# Patient Record
Sex: Male | Born: 1940 | Race: White | Hispanic: No | Marital: Married | Smoking: Former smoker
Health system: Southern US, Community
[De-identification: ages and names within clinical notes are randomized; demographics above are authoritative.]

## PROBLEM LIST (undated history)

## (undated) DIAGNOSIS — G459 Transient cerebral ischemic attack, unspecified: Secondary | ICD-10-CM

## (undated) DIAGNOSIS — E785 Hyperlipidemia, unspecified: Secondary | ICD-10-CM

## (undated) DIAGNOSIS — Z9289 Personal history of other medical treatment: Secondary | ICD-10-CM

## (undated) DIAGNOSIS — I4891 Unspecified atrial fibrillation: Secondary | ICD-10-CM

## (undated) DIAGNOSIS — I671 Cerebral aneurysm, nonruptured: Secondary | ICD-10-CM

## (undated) DIAGNOSIS — I2510 Atherosclerotic heart disease of native coronary artery without angina pectoris: Secondary | ICD-10-CM

## (undated) DIAGNOSIS — I10 Essential (primary) hypertension: Secondary | ICD-10-CM

## (undated) HISTORY — DX: Personal history of other medical treatment: Z92.89

## (undated) HISTORY — DX: Unspecified atrial fibrillation: I48.91

## (undated) HISTORY — DX: Essential (primary) hypertension: I10

## (undated) HISTORY — DX: Atherosclerotic heart disease of native coronary artery without angina pectoris: I25.10

## (undated) HISTORY — DX: Hyperlipidemia, unspecified: E78.5

## (undated) HISTORY — PX: APPENDECTOMY: SHX54

---

## 1998-04-27 ENCOUNTER — Encounter: Payer: Self-pay | Admitting: Neurosurgery

## 1998-04-27 ENCOUNTER — Inpatient Hospital Stay (HOSPITAL_COMMUNITY): Admission: EM | Admit: 1998-04-27 | Discharge: 1998-05-03 | Payer: Self-pay | Admitting: Emergency Medicine

## 1998-04-30 ENCOUNTER — Encounter: Payer: Self-pay | Admitting: Neurosurgery

## 1998-05-12 ENCOUNTER — Ambulatory Visit (HOSPITAL_COMMUNITY): Admission: RE | Admit: 1998-05-12 | Discharge: 1998-05-12 | Payer: Self-pay | Admitting: Neurosurgery

## 1998-05-12 ENCOUNTER — Encounter: Payer: Self-pay | Admitting: Neurosurgery

## 1998-11-27 ENCOUNTER — Encounter: Payer: Self-pay | Admitting: Neurosurgery

## 1998-11-27 ENCOUNTER — Ambulatory Visit (HOSPITAL_COMMUNITY): Admission: RE | Admit: 1998-11-27 | Discharge: 1998-11-27 | Payer: Self-pay | Admitting: Neurosurgery

## 2000-03-13 ENCOUNTER — Emergency Department (HOSPITAL_COMMUNITY): Admission: EM | Admit: 2000-03-13 | Discharge: 2000-03-13 | Payer: Self-pay | Admitting: Emergency Medicine

## 2000-03-13 ENCOUNTER — Encounter: Payer: Self-pay | Admitting: Emergency Medicine

## 2004-09-16 ENCOUNTER — Ambulatory Visit: Payer: Self-pay | Admitting: Cardiology

## 2005-10-13 ENCOUNTER — Ambulatory Visit: Payer: Self-pay | Admitting: Cardiology

## 2006-07-29 ENCOUNTER — Emergency Department (HOSPITAL_COMMUNITY): Admission: EM | Admit: 2006-07-29 | Discharge: 2006-07-29 | Payer: Self-pay | Admitting: Emergency Medicine

## 2006-08-30 ENCOUNTER — Encounter (INDEPENDENT_AMBULATORY_CARE_PROVIDER_SITE_OTHER): Payer: Self-pay | Admitting: *Deleted

## 2006-08-30 ENCOUNTER — Ambulatory Visit: Payer: Self-pay

## 2006-09-26 ENCOUNTER — Encounter: Payer: Self-pay | Admitting: *Deleted

## 2006-10-13 ENCOUNTER — Ambulatory Visit (HOSPITAL_COMMUNITY): Admission: RE | Admit: 2006-10-13 | Discharge: 2006-10-13 | Payer: Self-pay | Admitting: Interventional Radiology

## 2006-10-23 ENCOUNTER — Ambulatory Visit: Payer: Self-pay | Admitting: Cardiology

## 2006-12-18 ENCOUNTER — Ambulatory Visit: Payer: Self-pay | Admitting: Cardiology

## 2007-10-30 ENCOUNTER — Ambulatory Visit: Payer: Self-pay | Admitting: Cardiology

## 2007-10-30 LAB — CONVERTED CEMR LAB
ALT: 19 units/L (ref 0–53)
AST: 24 units/L (ref 0–37)
Bilirubin, Direct: 0.1 mg/dL (ref 0.0–0.3)
CO2: 29 meq/L (ref 19–32)
Chloride: 111 meq/L (ref 96–112)
Sodium: 143 meq/L (ref 135–145)
Total Bilirubin: 0.8 mg/dL (ref 0.3–1.2)
Total CHOL/HDL Ratio: 4
Triglycerides: 144 mg/dL (ref 0–149)

## 2008-09-02 ENCOUNTER — Telehealth: Payer: Self-pay | Admitting: Cardiology

## 2008-10-16 DIAGNOSIS — Z8679 Personal history of other diseases of the circulatory system: Secondary | ICD-10-CM | POA: Insufficient documentation

## 2008-10-16 DIAGNOSIS — E785 Hyperlipidemia, unspecified: Secondary | ICD-10-CM

## 2008-10-16 DIAGNOSIS — I4891 Unspecified atrial fibrillation: Secondary | ICD-10-CM | POA: Insufficient documentation

## 2008-10-20 ENCOUNTER — Ambulatory Visit: Payer: Self-pay | Admitting: Cardiology

## 2008-10-20 DIAGNOSIS — E783 Hyperchylomicronemia: Secondary | ICD-10-CM | POA: Insufficient documentation

## 2008-10-22 ENCOUNTER — Ambulatory Visit: Payer: Self-pay | Admitting: Cardiology

## 2008-10-22 ENCOUNTER — Ambulatory Visit (HOSPITAL_COMMUNITY): Admission: RE | Admit: 2008-10-22 | Discharge: 2008-10-22 | Payer: Self-pay | Admitting: Interventional Radiology

## 2008-11-03 ENCOUNTER — Telehealth: Payer: Self-pay | Admitting: Cardiology

## 2008-11-03 LAB — CONVERTED CEMR LAB
Albumin: 3.9 g/dL (ref 3.5–5.2)
BUN: 19 mg/dL (ref 6–23)
Basophils Relative: 0.1 % (ref 0.0–3.0)
Calcium: 8.9 mg/dL (ref 8.4–10.5)
Cholesterol: 122 mg/dL (ref 0–200)
Eosinophils Absolute: 0.1 10*3/uL (ref 0.0–0.7)
GFR calc non Af Amer: 64 mL/min (ref >60)
Glucose, Bld: 86 mg/dL (ref 70–99)
HDL: 31.8 mg/dL — ABNORMAL LOW (ref >39.00)
Lymphs Abs: 1.6 10*3/uL (ref 0.7–4.0)
MCHC: 34.3 g/dL (ref 30.0–36.0)
MCV: 94 fL (ref 78.0–100.0)
Monocytes Absolute: 0.5 10*3/uL (ref 0.1–1.0)
Neutrophils Relative %: 71.9 % (ref 43.0–77.0)
RBC: 4.78 M/uL (ref 4.22–5.81)
VLDL: 14.6 mg/dL (ref 0.0–40.0)

## 2008-11-27 ENCOUNTER — Telehealth: Payer: Self-pay | Admitting: Cardiology

## 2009-07-08 ENCOUNTER — Telehealth (INDEPENDENT_AMBULATORY_CARE_PROVIDER_SITE_OTHER): Payer: Self-pay | Admitting: *Deleted

## 2009-10-06 ENCOUNTER — Ambulatory Visit: Payer: Self-pay | Admitting: Cardiology

## 2010-04-06 NOTE — Assessment & Plan Note (Signed)
Summary: F1Y/ANAS   Visit Type:  Follow-up Primary Virgina Deakins:  [facility]  CC:  no complaints.  History of Present Illness: Mr Wiswell present evaluation and management of his chronic atrial fibrillation.  History remarkably well. He continues to work on the farm. Orthopedic issues only thing limiting him.  He denies any palpitations, orthopnea, PND, peripheral edema, or chest pain. He's had no symptoms of TIAs or mini strokes.  He remains on Crestor as well as his Aggrenox. He has had a previous stroke.  Current Medications (verified): 1)  Crestor 5 Mg Tabs (Rosuvastatin Calcium) .Marland Kitchen.. 1 Tab At Bedtime 2)  Metoprolol Succinate 25 Mg Xr24h-Tab (Metoprolol Succinate) .Marland Kitchen.. 1 Tab Once Daily 3)  Aggrenox 25-200 Mg Xr12h-Cap (Aspirin-Dipyridamole) .Marland Kitchen.. 1 Tab Two Times A Day 4)  Vitamin C 500 Mg Tabs (Ascorbic Acid) .Marland Kitchen.. 1 Tab Once Daily 5)  Multivitamins   Tabs (Multiple Vitamin) .Marland Kitchen.. 1 Tab Once Daily  Allergies (verified): 1)  ! Zocor 2)  ! Lipitor (Atorvastatin)  Past History:  Past Medical History: Last updated: 10/16/2008 ATRIAL FIBRILLATION, CHRONIC (ICD-427.31) HYPERLIPIDEMIA-MIXED (ICD-272.4) TRANSIENT ISCHEMIC ATTACKS, HX OF (ICD-V12.50)    Past Surgical History: Last updated: 10/16/2008 Appendectomy  Family History: Last updated: 10/16/2008 Mother: arthritis Father: deceased in his 76's..hx of CAD and CHF  Social History: Last updated: 10/16/2008 Full Time Married  Tobacco Use - Former. quit 1999  Risk Factors: Smoking Status: quit (10/16/2008)  Review of Systems       negative history of present illness  Vital Signs:  Patient profile:   70 year old male Height:      70 inches Weight:      183 pounds BMI:     26.35 Pulse rate:   77 / minute BP sitting:   108 / 72  (left arm) Cuff size:   regular  Vitals Entered By: Hardin Negus, RMA (October 06, 2009 3:41 PM)  Physical Exam  General:  Well developed, well nourished, in no acute  distress. Head:  normocephalic and atraumatic Eyes:  PERRLA/EOM intact; conjunctiva and lids normal. Chest Wall:  no deformities or breast masses noted Lungs:  Clear bilaterally to auscultation and percussion. Heart:  PMI nondisplaced, regular rate and rhythm, no gallop Msk:  Back normal, normal gait. Muscle strength and tone normal. Pulses:  pulses normal in all 4 extremities Extremities:  No clubbing or cyanosis. Neurologic:  Alert and oriented x 3. Skin:  Intact without lesions or rashes. Psych:  Normal affect.   EKG  Procedure date:  10/06/2009  Findings:      chronic atrial fibrillation, no change.  Impression & Recommendations:  Problem # 1:  ATRIAL FIBRILLATION, CHRONIC (ICD-427.31) Assessment Unchanged  His updated medication list for this problem includes:    Metoprolol Succinate 25 Mg Xr24h-tab (Metoprolol succinate) .Marland Kitchen... 1 tab once daily    Aggrenox 25-200 Mg Xr12h-cap (Aspirin-dipyridamole) .Marland Kitchen... 1 tab two times a day  Orders: EKG w/ Interpretation (93000)  Problem # 2:  HYPERLIPIDEMIA-MIXED (ICD-272.4)  His updated medication list for this problem includes:    Crestor 5 Mg Tabs (Rosuvastatin calcium) .Marland Kitchen... 1 tab at bedtime  Problem # 3:  TRANSIENT ISCHEMIC ATTACKS, HX OF (ICD-V12.50) Assessment: Improved  Patient Instructions: 1)  Your physician recommends that you schedule a follow-up appointment in: 1 year 2)  Your physician recommends that you continue on your current medications as directed. Please refer to the Current Medication list given to you today.

## 2010-04-06 NOTE — Progress Notes (Signed)
  LOV,12,LAbs faxed over to [facility] [phone] [facility]  Jul 08, 2009 12:38 PM

## 2010-06-12 LAB — CREATININE, SERUM: GFR calc Af Amer: >60 mL/min (ref >60)

## 2010-07-20 NOTE — Assessment & Plan Note (Signed)
[facility]                            CARDIOLOGY OFFICE NOTE   NAME:[name], Ernest                           MRN:          [id number]  DATE:12/18/2006                            DOB:          01/19/1941    [name] returns today for further management of his hyperlipidemia. I  had placed him on Crestor because of a recent cerebrovascular event. 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 He was not on a  statin which Dr. Nash Shearer had advised. He is intolerant of Lipitor and  Pravachol in the past as well as Zetia.   I have placed him on Crestor 5 mg a day. He is having no problems with  the medication. 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 His cholesterol has  dropped from 180 to 134, triglycerides from 83 to 60, HDL has gone up  from 34 to 40. His LDL has dropped from 129 to 82, LFTs were normal.   I have discussed this with he and his wife. His blood pressure is 90/68.  He looks remarkably good.   I have made no changes in his program. I have set him up for followup  with me in September 2009 at which time we will get lipids and LFTs and  clinical followup. He will continue on Toprol, Aggrenox and Crestor.     Thomas C. Daleen Squibb, MD, [facility]  Electronically Signed    TCW/MedQ  DD: 12/18/2006  DT: 12/19/2006  Job #: [id number]   cc:   Estanislado Pandy, MD

## 2010-07-20 NOTE — Consult Note (Signed)
NAME:  Ernest [name], Ernest [name]                  ACCOUNT NO.:  [id number]   MEDICAL RECORD NO.:  [id number]          PATIENT TYPE:  OUT   LOCATION:  XRAY                         FACILITY:  [facility]   PHYSICIAN:  Dr. Corliss Skains          DATE OF BIRTH:  03/26/1940   DATE OF CONSULTATION:  DATE OF DISCHARGE:                                 CONSULTATION   CHIEF COMPLAINT:  Cerebrovascular disease.   HISTORY OF PRESENT ILLNESS:  This is a very pleasant 70 year old male  who was referred to Dr. Corliss Skains through the courtesy of Dr. Nash Shearer  for further evaluation of cerebrovascular disease.  The patient had what  sounds like a TIA in May 2008.  At that time, he had some right-sided  numbness as well as a aphasia that lasted approximately 20 minutes.  EMS  was summoned, and the patient's symptoms resolved after he was given  nasal cannula oxygen.   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  A formal cerebral angiogram has been recommended for further evaluation.  The patient presents today accompanied by his wife to discuss the  angiogram and potential treatment options.  He has had no further TIA  since the event and May.   PAST MEDICAL HISTORY:  1. The patient has a history of hyperlipidemia which is mild.  He has      been intolerant to statins.  2. He has history of chronic atrial fibrillation.  He had been on      Coumadin in the past.  However this was stopped due to frequent      bruising.  3. He also has a history of an intracerebral hemorrhage.  Apparently      he was not on Coumadin at that time.  The etiology was uncertain.      However, the patient made a full recovery.  Apparently he had a      cerebral angiogram performed at that time.  This was about 10 years      ago.  We do not have the results of that study, but  according to      the patient, it failed to show the source of the bleeding.  The      patient also has a history of coronary artery disease.  He had a      heart catheterization in the past that showed nonobstructive      coronary disease.   SURGICAL HISTORY:  Significant for an appendectomy.  The patient reports  no previous problems with anesthesia.   ALLERGIES:  He is intolerant to statins.  There are no known drug  allergies.  He is not allergic to contrast dye or of to latex.   CURRENT MEDICATIONS:  1. Metoprolol.  2. Multivitamin.  3. Vitamin C.  4. Aggrenox.   SOCIAL HISTORY:  The patient is married.  He has two daughters who live  in [place].  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  He quit tobacco in 1999.  He did smoke as well as  chew.   FAMILY HISTORY:  His mother is alive at age 28.  She has arthritis.  His  father died in his 79s.  He had coronary artery disease and congestive  heart failure.   IMPRESSION AND PLAN:  As noted, the patient presents today for further  evaluation of cerebrovascular disease.  He is accompanied by his wife.  Dr. Corliss Skains reviewed the results of the recent MRI/MRA performed August 17, 2006, with the patient and his wife.  He pointed out the absence of  the right vertebral artery.  It could not be ascertained for sure based  on the MRA results, whether this was totally occluded or just severely  stenosed.  A cerebral angiogram has been recommended for further  evaluation.  The patient also may require PTA stenting of the artery, if  there is enough flow to warrant intervention.   The cerebral angiogram as well as a PTA stenting procedures were  described in detail along with the risks and benefits.  The patient and  his wife are anxious to proceed with the angiogram for further  evaluation.  They have a trip planned to [place] at the end of this  month.  We will plan on performing a cerebral  angiogram in early August.  Other recommendations will be based on the angiogram results.   Greater than 40 minutes was spent on this consult.      Delton See, P.A.    ______________________________  Dr. Corliss Skains    DR/MEDQ  D:  09/26/2006  T:  09/26/2006  Job:  [id number]   cc:   Thomas C. Daleen Squibb, MD, [facility]  Estanislado Pandy, MD  Hilda Lias, M.D.

## 2010-07-20 NOTE — Assessment & Plan Note (Signed)
[facility]                            CARDIOLOGY OFFICE NOTE   NAME:Ernest [name], Ernest [name]                           MRN:          [id number]  DATE:10/23/2006                            DOB:          1940/04/08    Ernest [name] comes in today for a followup of his chronic atrial fib.   He had a right sided TIA with right sided numbness and weakness on  Memorial Day weekend.  I referred him to Dr. Nash Shearer of [facility]  Neurology.  He had an extensive evaluation including an arteriogram by  Dr. Corliss Skains.  This showed a totally occluded right vertebral with  distal retrograde reconstitution at the right vertebrobasilar junction.  Medical therapy was recommended including antiplatelets and Statins.  Unfortunately, he has not started a Statin though Dr. Nash Shearer wrote him  for Simvastatin.  He has taken Lipitor and Pravachol in the past as well  as Zetia.  He states Lipitor and Pravachol dropped his blood sugar which  I have argued with him about.  Zetia caused muscle aches.   His LDL runs about 125-130.  He has a low HDL.   His meds are:  1. Vitamin C 500 mg a day.  2. Multivitamin daily.  3. Toprol XL 25 mg a day.  4. Aggrenox 25/200 b.i.d.   PHYSICAL EXAMINATION:  VITAL SIGNS:  His blood pressure is 119/78.  His  pulse 70 and irregular.  His weight is 177.  EKG confirms atrial fib.  HEENT:  Normocephalic atraumatic.  PERRLA.  Extraocular movements  intact.  Sclerae are clear.  Facial symmetry is normal.  NECK:  Supple.  Carotids are full bilaterally without bruit.  There is  no thyromegaly.  Trachea is midline.  LUNGS:  Clear.  HEART:  Reveals a nondisplaced PMI.  A variable rate and rhythm.  ABDOMEN:  Soft.  Good bowel sounds.  No midline bruit.  EXTREMITIES:  Reveal no cyanosis, clubbing, or edema.  Pulses are brisk.  NEUROLOGIC:  Grossly intact.   He also had a transthoracic echo in our office on August 30, 2006.  This  showed normal left ventricular chamber  size, EF 50-55%, mild mitral  valve regurgitation, moderately dilated left atrium, redundancy of the  interatrial septum, moderate dilatation of the right atrium.  There was  no obvious clot.   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  I am  surprised he did not have more brain stem symptoms, however.  Fortunately, it did resolve.  I do not think that he had a cardiogenic  source of embolus.  This most likely was plaque and intrinsic clot that  occluded his vessel. I feel strongly that he does need to remain on  antiplatelet therapy and a Statin.  I have had a good  discussion with he and his wife.  They are willing to try Crestor at 5  mg a day.  I will schedule blood work in about 6 weeks and followup with  me in about 8-10  weeks.     Ernest Fus C. Daleen Squibb, MD, [facility]  Electronically Signed    TCW/MedQ  DD: 10/23/2006  DT: 10/23/2006  Job #: [id number]   cc:   Ernest Pandy, MD

## 2010-07-20 NOTE — Assessment & Plan Note (Signed)
[facility]                            CARDIOLOGY OFFICE NOTE   NAME:Riecke, Jermarcus                           MRN:          [id number]  DATE:10/30/2007                            DOB:          16-Oct-1940    Levi comes in today for further management of his chronic atrial fib,  hyperlipidemia, history of right-sided TIA with total resolution on last  Memorial Day 2008.   At that time, he had a totally occluded right vertebral with distal  retrograde reconstitution at his right vertebrobasilar junction.  Medical therapy was recommended.   Fortunately, he has been able to tolerate the Crestor with a significant  improvement in his lipids.  His last LDL was 82.  His HDL was 40.  His  total cholesterol 134.   He continues on,  1. Crestor 5 mg a day.  2. Aggrenox 25/200 b.i.d.  3. Toprol-XL 25 mg a day.  4. Multivitamin daily.  5. Vitamin C 500 mg a day.   He looks remarkably good today.  He denies any orthopnea or PND,  tachypalpitations, presyncope, or syncope.  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   PHYSICAL EXAMINATION:  VITAL SIGNS:  His blood pressure today is 110/72,  his pulse is 68 and regular.  HEENT:  Normocephalic and atraumatic.  PERRLA and extraocular movements  are intact.  Sclerae are clear.  Facial symmetry is normal.  NECK:  Carotid upstrokes are equal bilaterally without bruits, no JVD.  Thyroid is not enlarged.  Trachea is midline.  LUNGS:  Clear.  HEART:  Irregular rate and rhythm, well-controlled rate.  ABDOMEN:  Soft, good bowel sounds.  No midline bruits.  EXTREMITY:  No cyanosis, clubbing, or edema.  Pulses are intact.  NEURO:  Intact.   EKG shows atrial fib with a well-controlled ventricular rate.   ASSESSMENT AND PLAN:  Remer is doing very well.  I have made no changes  to his medical program.  We checked lipids today as well as a  comprehensive metabolic panel.   Assuming these are stable, we will see  him back again in a year.   Please note that he has had an intracranial bleed on aspirin in the  past.     Maisie Fus C. Daleen Squibb, MD, [facility]  Electronically Signed    TCW/MedQ  DD: 10/30/2007  DT: 10/31/2007  Job #: [id number]

## 2010-07-23 NOTE — Assessment & Plan Note (Signed)
[facility]                              CARDIOLOGY OFFICE NOTE   NAME:Bowermaster, Danton                           MRN:          [id number]  DATE:10/13/2005                            DOB:          Sep 01, 1940    HISTORY:  1. Mr. Trull returns today for further management of his chronic atrial      fib.  He is totally asymptomatic.  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      He is on aspirin 325 a day because of a previous intracranial bleed.      This was actually on aspirin but he has been cleared by neurosurgery.  2. Hyperlipidemia.  He has been intolerant of statins and Zetia.  He      watches his diet very closely.  3. Nonobstructive coronary disease.   He offers no complaints today.   EXAMINATION:  VITAL SIGNS:  Blood pressure 116/90, pulse 66 and irregular.  His EKG shows atrial fib with no changes.  His weight is 177.  NECK:  Carotid upstrokes are equal bilaterally without bruits.  No JVD.  Thyroid is not enlarged.  Trachea is midline.  LUNGS:  Clear.  HEART:  Irregular rate and rhythm.  No murmurs.  ABDOMEN:  Soft with good bowel sounds.  There is no midline bruit.  There is  no hepatomegaly.  EXTREMITIES:  No clubbing, cyanosis or edema.  Pulses are intact.   ASSESSMENT/PLAN:  Kyrese is doing well.  I have renewed his metoprolol CR or  XL 25 mg a day.  He will continue with aspirin 325 a day.  I will see him  back in a year.   He has no one drawing blood work for him, i.e. no primary care physician.  He is having some fatigue.   I have written to get a CBC, Comprehensive Metabolic Panel, TSH, and a PSA.                               Thomas C. Daleen Squibb, MD, [facility]    TCW/MedQ  DD:  10/13/2005  DT:  10/13/2005  Job #:  [id number]

## 2010-10-20 ENCOUNTER — Encounter: Payer: Self-pay | Admitting: Cardiology

## 2010-10-20 ENCOUNTER — Ambulatory Visit (INDEPENDENT_AMBULATORY_CARE_PROVIDER_SITE_OTHER): Payer: Medicare Other | Admitting: Cardiology

## 2010-10-20 VITALS — BP 110/74 | HR 88 | Resp 12 | Ht 70.0 in | Wt 187.0 lb

## 2010-10-20 DIAGNOSIS — Z8679 Personal history of other diseases of the circulatory system: Secondary | ICD-10-CM

## 2010-10-20 DIAGNOSIS — I4891 Unspecified atrial fibrillation: Secondary | ICD-10-CM

## 2010-10-20 DIAGNOSIS — E785 Hyperlipidemia, unspecified: Secondary | ICD-10-CM

## 2010-10-20 NOTE — Patient Instructions (Signed)
Your physician recommends that you follow up in: 12 Months. You will receive a reminder letter in the mail two months in advance. If you don't receive a letter, please call our office to schedule the follow up appointment. Your physician recommends that you continue on your current medications as directed. Please refer to the Current Medication list given to you today.

## 2010-10-20 NOTE — Assessment & Plan Note (Signed)
Stable. No change in treatment. Followup in a year 

## 2010-10-20 NOTE — Progress Notes (Signed)
HPI Ernest [name] comes in for evaluation and management of his chronic A. Fib. Other than some mild dyspnea on exertion, he has no new complaints. He denies any chest pain or angina. His labs and other problems are followed by his primary care in [place].  He is very compliant. Meds reviewed.  EKG shows chronic A. Fib with controlled ventricular rate. Past Medical History  Diagnosis Date  . Clotting disorder   . Hyperlipidemia   . Atrial fibrillation   . Hypertension     History reviewed. No pertinent past surgical history.  Family History  Problem Relation Age of Onset  . Heart disease Father   . Hypertension Father     History   Social History  . Marital Status: Married    Spouse Name: N/A    Number of Children: N/A  . Years of Education: N/A   Occupational History  . Not on file.   Social History Main Topics  . Smoking status: Former Games developer  . Smokeless tobacco: Not on file  . Alcohol Use: Not on file  . Drug Use: Not on file  . Sexually Active: Not on file   Other Topics Concern  . Not on file   Social History Narrative  . No narrative on file    Allergies  Allergen Reactions  . Atorvastatin   . Simvastatin     Current Outpatient Prescriptions  Medication Sig Dispense Refill  . AGGRENOX 25-200 MG per 12 hr capsule Take 1 tablet by mouth Daily.      . CRESTOR 5 MG tablet Take 1 tablet by mouth Daily.      . Multiple Vitamin (MULTIVITAMIN) capsule Take 1 capsule by mouth daily.        . TOPROL XL 25 MG 24 hr tablet Take 1 tablet by mouth Daily.      . vitamin C (ASCORBIC ACID) 500 MG tablet Take 500 mg by mouth daily.          ROS Negative other than HPI.   PE General Appearance: well developed, well nourished in no acute distress HEENT: symmetrical face, PERRLA, good dentition  Neck: no JVD, thyromegaly, or adenopathy, trachea midline Chest: symmetric without deformity Cardiac: PMI non-displaced, RRR, normal S1, S2, no gallop or murmur Lung:  clear to ausculation and percussion Vascular: all pulses full without bruits  Abdominal: nondistended, nontender, good bowel sounds, no HSM, no bruits Extremities: no cyanosis, clubbing or edema, no sign of DVT, no varicosities  Skin: normal color, no rashes Neuro: alert and oriented x 3, non-focal Pysch: normal affect Filed Vitals:   10/20/10 0918  BP: 110/74  Pulse: 88  Resp: 12  Height: 5\' 10"  (1.778 m)  Weight: 187 lb (84.823 kg)    EKG  Labs and Studies Reviewed.   Lab Results  Component Value Date   WBC 7.9 10/22/2008   HGB 15.4 10/22/2008   HCT 44.9 10/22/2008   MCV 94.0 10/22/2008   PLT 210.0 10/22/2008      Chemistry      Component Value Date/Time   NA 143 10/22/2008 0956   K 4.8 10/22/2008 0956   CL 109 10/22/2008 0956   CO2 33* 10/22/2008 0956   BUN 18 10/22/2008 1048   CREATININE 1.17 10/22/2008 1048      Component Value Date/Time   CALCIUM 8.9 10/22/2008 0956   ALKPHOS 55 10/22/2008 0956   AST 19 10/22/2008 0956   ALT 18 10/22/2008 0956   BILITOT 1.0 10/22/2008 0956  Lab Results  Component Value Date   CHOL 122 10/22/2008   CHOL 147 10/30/2007   Lab Results  Component Value Date   HDL 31.80* 10/22/2008   HDL 36.9* 10/30/2007   Lab Results  Component Value Date   LDLCALC 76 10/22/2008   LDLCALC 81 10/30/2007   Lab Results  Component Value Date   TRIG 73.0 10/22/2008   TRIG 144 10/30/2007   Lab Results  Component Value Date   CHOLHDL 4 10/22/2008   CHOLHDL 4.0 CALC 10/30/2007   No results found for this basename: HGBA1C   Lab Results  Component Value Date   ALT 18 10/22/2008   AST 19 10/22/2008   ALKPHOS 55 10/22/2008   BILITOT 1.0 10/22/2008   No results found for this basename: TSH   and

## 2010-11-10 ENCOUNTER — Telehealth: Payer: Self-pay | Admitting: Cardiology

## 2010-11-10 NOTE — Telephone Encounter (Signed)
Patient had given the wrong information on the aggrenox.  Please advise on the new direction / dosage

## 2010-11-10 NOTE — Telephone Encounter (Signed)
LMTCB Debbie Dyllin Gulley RN  

## 2010-11-10 NOTE — Telephone Encounter (Signed)
Wife calls today to confirm pt is taking aggrenox q12 hours as directed I will update medication list. Mylo Red RN

## 2010-12-20 LAB — BASIC METABOLIC PANEL
CO2: 29
Calcium: 9
Chloride: 103
Glucose, Bld: 88
Potassium: 4
Sodium: 137

## 2010-12-20 LAB — CBC
HCT: 41.5
Hemoglobin: 14.4
MCHC: 34.8
MCV: 91.4
RDW: 13.3

## 2011-02-09 ENCOUNTER — Other Ambulatory Visit: Payer: Self-pay | Admitting: Cardiology

## 2011-02-11 ENCOUNTER — Other Ambulatory Visit: Payer: Self-pay | Admitting: Cardiology

## 2011-02-11 NOTE — Telephone Encounter (Signed)
rx request... Not sure if they meant 30 or literally 3 with 2 refills. No history of exactly how it was ordered. Maybe you will have better luck finding it.

## 2011-03-10 ENCOUNTER — Emergency Department (HOSPITAL_COMMUNITY)
Admission: EM | Admit: 2011-03-10 | Discharge: 2011-03-10 | Disposition: A | Discharge status: Home / Self Care | Payer: Medicare Other | Attending: Emergency Medicine | Admitting: Emergency Medicine

## 2011-03-10 ENCOUNTER — Encounter (HOSPITAL_COMMUNITY): Payer: Self-pay

## 2011-03-10 ENCOUNTER — Emergency Department (HOSPITAL_COMMUNITY): Payer: Medicare Other

## 2011-03-10 ENCOUNTER — Other Ambulatory Visit: Payer: Self-pay

## 2011-03-10 DIAGNOSIS — J3489 Other specified disorders of nose and nasal sinuses: Secondary | ICD-10-CM | POA: Insufficient documentation

## 2011-03-10 DIAGNOSIS — R5381 Other malaise: Secondary | ICD-10-CM | POA: Insufficient documentation

## 2011-03-10 DIAGNOSIS — R5383 Other fatigue: Secondary | ICD-10-CM | POA: Insufficient documentation

## 2011-03-10 DIAGNOSIS — I10 Essential (primary) hypertension: Secondary | ICD-10-CM | POA: Insufficient documentation

## 2011-03-10 DIAGNOSIS — R05 Cough: Secondary | ICD-10-CM | POA: Insufficient documentation

## 2011-03-10 DIAGNOSIS — IMO0001 Reserved for inherently not codable concepts without codable children: Secondary | ICD-10-CM | POA: Insufficient documentation

## 2011-03-10 DIAGNOSIS — E785 Hyperlipidemia, unspecified: Secondary | ICD-10-CM | POA: Insufficient documentation

## 2011-03-10 DIAGNOSIS — R42 Dizziness and giddiness: Secondary | ICD-10-CM | POA: Insufficient documentation

## 2011-03-10 DIAGNOSIS — I959 Hypotension, unspecified: Secondary | ICD-10-CM | POA: Insufficient documentation

## 2011-03-10 DIAGNOSIS — R509 Fever, unspecified: Secondary | ICD-10-CM | POA: Insufficient documentation

## 2011-03-10 DIAGNOSIS — R0602 Shortness of breath: Secondary | ICD-10-CM | POA: Insufficient documentation

## 2011-03-10 DIAGNOSIS — E860 Dehydration: Secondary | ICD-10-CM | POA: Insufficient documentation

## 2011-03-10 DIAGNOSIS — R059 Cough, unspecified: Secondary | ICD-10-CM | POA: Insufficient documentation

## 2011-03-10 LAB — DIFFERENTIAL
Eosinophils Absolute: 0.1 10*3/uL (ref 0.0–0.7)
Eosinophils Relative: 1 % (ref 0–5)
Lymphocytes Relative: 9 % — ABNORMAL LOW (ref 12–46)
Lymphs Abs: 1.5 10*3/uL (ref 0.7–4.0)
Monocytes Relative: 7 % (ref 3–12)

## 2011-03-10 LAB — CULTURE, BLOOD (ROUTINE X 2)
Culture  Setup Time: 201301040240
Culture  Setup Time: 201301040240
Culture: NO GROWTH

## 2011-03-10 LAB — CBC
Hemoglobin: 16.2 g/dL (ref 13.0–17.0)
MCH: 32.3 pg (ref 26.0–34.0)
MCV: 92.2 fL (ref 78.0–100.0)
RBC: 5.02 MIL/uL (ref 4.22–5.81)

## 2011-03-10 LAB — CARDIAC PANEL(CRET KIN+CKTOT+MB+TROPI)
CK, MB: 1.9 ng/mL (ref 0.3–4.0)
Relative Index: INVALID (ref 0.0–2.5)
Total CK: 46 U/L (ref 7–232)
Troponin I: <0.3 ng/mL (ref <0.30)

## 2011-03-10 LAB — COMPREHENSIVE METABOLIC PANEL
Alkaline Phosphatase: 67 U/L (ref 39–117)
BUN: 21 mg/dL (ref 6–23)
CO2: 25 mEq/L (ref 19–32)
Calcium: 9.5 mg/dL (ref 8.4–10.5)
GFR calc Af Amer: 70 mL/min — ABNORMAL LOW (ref >90)
GFR calc non Af Amer: 60 mL/min — ABNORMAL LOW (ref >90)
Glucose, Bld: 96 mg/dL (ref 70–99)
Potassium: 4.2 mEq/L (ref 3.5–5.1)
Total Protein: 8.2 g/dL (ref 6.0–8.3)

## 2011-03-10 LAB — URINALYSIS, ROUTINE W REFLEX MICROSCOPIC
Ketones, ur: 15 mg/dL — AB
Leukocytes, UA: NEGATIVE
Nitrite: NEGATIVE
Urobilinogen, UA: 0.2 mg/dL (ref 0.0–1.0)
pH: 5.5 (ref 5.0–8.0)

## 2011-03-10 LAB — LACTIC ACID, PLASMA: Lactic Acid, Venous: 1 mmol/L (ref 0.5–2.2)

## 2011-03-10 MED ORDER — SODIUM CHLORIDE 0.9 % IV BOLUS (SEPSIS): 1000.0000 mL | Freq: Once | INTRAVENOUS | Status: DC

## 2011-03-10 MED ORDER — SODIUM CHLORIDE 0.9 % IV BOLUS (SEPSIS)
1000.0000 mL | Freq: Once | INTRAVENOUS | Status: AC
  Administered 2011-03-10: 1000 mL via INTRAVENOUS

## 2011-03-10 NOTE — ED Provider Notes (Signed)
Patient presenting with orthostatic hypotension from his physician's office. He states that over the last one to 2 weeks he has felt generally weak as well as mildly short of breath with exertion. He proximally 2 weeks ago had muscle aches but no vomiting or diarrhea. He has had a poor appetite. He was noted to have a systolic blood pressure 79 in his primary doctor's office. Therefore was sent to the ED for further evaluation. On exam he is awake and alert. He appears nontoxic and well-hydrated with clear lungs benign abdomen. Initial labs and IV hydration as well as EKG was ordered in triage. Patient is moved to a room in the ED for further evaluation and management.  Ethelda Chick, MD 03/10/11 [phone]

## 2011-03-10 NOTE — ED Notes (Signed)
Patient is resting comfortably. 

## 2011-03-10 NOTE — ED Notes (Signed)
Pt. Was sent to [place] from [facility].  Due to hypotension.  Pt. Has has a  Intermittent fever, sob on exertion,  And weak for the last 2 weeks

## 2011-03-10 NOTE — ED Provider Notes (Signed)
History     CSN: [id number]  Arrival date & time 03/10/11  1208   First MD Initiated Contact with Patient 03/10/11 1646      Chief Complaint  Patient presents with  . Hypotension    (Consider location/radiation/quality/duration/timing/severity/associated sxs/prior treatment) HPI Comments: Patient is a 71 year old male with a one and a half week history of cough. Initially when symptoms started he felt like he had the flu with generalized body aches, fever, productive cough, congestion. He started taking Mucinex and Alka-Seltzer and he states the congestion and cough improved. However the last few days he has had worsening shortness of breath and weakness with only minimal exertion. Also when he stands for any length of time he feels very dizzy and lightheaded. He denies any chest pain and states his symptoms are improved if he sits down and rests. He saw his PCP today and had hypotension with a blood pressure of 79/50 taken twice. He states he did take his metoprolol he were going to see his PCP.  The history is provided by the patient.    Past Medical History  Diagnosis Date  . Clotting disorder   . Hyperlipidemia   . Campath-induced atrial fibrillation   . Hypertension     Past Surgical History  Procedure Date  . Appendectomy     Family History  Problem Relation Age of Onset  . Heart disease Father   . Hypertension Father   . Heart failure Father   . Arthritis Mother     History  Substance Use Topics  . Smoking status: Former Games developer  . Smokeless tobacco: Never Used  . Alcohol Use: No      Review of Systems  Constitutional: Positive for fever.  Respiratory: Positive for cough and shortness of breath.   Cardiovascular: Negative for chest pain and leg swelling.  Gastrointestinal: Negative for nausea, vomiting, abdominal pain, diarrhea and blood in stool.  Neurological: Positive for weakness and light-headedness.  Psychiatric/Behavioral: Negative for confusion.    All other systems reviewed and are negative.    Allergies  Atorvastatin and Simvastatin  Home Medications   Current Outpatient Rx  Name Route Sig Dispense Refill  . AGGRENOX 25-200 MG PO CP12  TAKE 1 CAPSULE TWICE A DAY 3 capsule 2  . CRESTOR 5 MG PO TABS  TAKE 1 TABLET AT BEDTIME 90 tablet 2  . DM-GUAIFENESIN ER 30-600 MG PO TB12 Oral Take 1 tablet by mouth every 12 (twelve) hours.      Marland Kitchen METOPROLOL SUCCINATE ER 25 MG PO TB24  TAKE 1 TABLET ONCE DAILY 90 tablet 2  . MULTIVITAMINS PO CAPS Oral Take 1 capsule by mouth daily.      Marland Kitchen ALKA-SELTZER PLUS COLD & COUGH PO Oral Take 1 tablet by mouth 2 (two) times daily.      Marland Kitchen VITAMIN C 500 MG PO TABS Oral Take 500 mg by mouth daily.        BP 102/74  Pulse 84  Temp(Src) 98.2 F (36.8 C) (Oral)  Resp 18  Ht 6' (1.829 m)  Wt 180 lb (81.647 kg)  BMI 24.41 kg/m2  SpO2 97%  Physical Exam  Nursing note and vitals reviewed. Constitutional: He is oriented to person, place, and time. He appears well-developed and well-nourished. No distress.  HENT:  Head: Normocephalic and atraumatic.  Mouth/Throat: Oropharynx is clear and moist.  Eyes: Conjunctivae and EOM are normal. Pupils are equal, round, and reactive to light.  Neck: Normal range of motion. Neck supple.  Cardiovascular: Normal rate, regular rhythm and intact distal pulses.   No murmur heard. Pulmonary/Chest: Effort normal and breath sounds normal. No respiratory distress. He has no wheezes. He has no rales.  Abdominal: Soft. He exhibits no distension. There is no tenderness. There is no rebound and no guarding.  Musculoskeletal: Normal range of motion. He exhibits no edema and no tenderness.  Neurological: He is alert and oriented to person, place, and time.  Skin: Skin is warm and dry. No rash noted. No erythema.  Psychiatric: He has a normal mood and affect. His behavior is normal.    ED Course  Procedures (including critical care time)  Labs Reviewed  CBC - Abnormal;  Notable for the following:    WBC 16.8 (*)    All other components within normal limits  DIFFERENTIAL - Abnormal; Notable for the following:    Neutrophils Relative 83 (*)    Neutro Abs 13.9 (*)    Lymphocytes Relative 9 (*)    Monocytes Absolute 1.3 (*)    All other components within normal limits  COMPREHENSIVE METABOLIC PANEL - Abnormal; Notable for the following:    GFR calc non Af Amer 60 (*)    GFR calc Af Amer 70 (*)    All other components within normal limits  URINALYSIS, ROUTINE W REFLEX MICROSCOPIC - Abnormal; Notable for the following:    Ketones, ur 15 (*)    All other components within normal limits  LACTIC ACID, PLASMA  CARDIAC PANEL(CRET KIN+CKTOT+MB+TROPI)  URINE CULTURE  CULTURE, BLOOD (ROUTINE X 2)  CULTURE, BLOOD (ROUTINE X 2)   No results found.   Date: 03/10/2011  Rate: 89  Rhythm: atrial fibrillation  QRS Axis: normal  Intervals: normal  ST/T Wave abnormalities: normal  Conduction Disutrbances:none  Narrative Interpretation:   Old EKG Reviewed: unchanged    No diagnosis found.    MDM   Patient has had illness for almost 2 weeks initially starting with cough, fever, congestion and now those symptoms have resolved and he is short of breath and weak worse with exertion and if he stands too long he feels dizzy. He was seen his PCP office today and had a blood pressure of 79/50. Since he has been here his blood pressure does not drop below 100/70 however he states he just feels weak. He denies any chest pain, swelling, abdominal pain, vomiting, diarrhea. He is well-appearing on exam. CBC with leukocytosis of 16,000 and chest x-ray within normal limits. EKG shows persistent atrial fibrillation without change. BMP, UA, lactate all pending. Will give patient a bolus of IV fluid as there is no sign of fluid overload.   8:57 PM Other than an elevated leukocytosis all labs and films are within normal limits. After 1 L of fluid evaluated patient and he feels  much better. Although his past medical history his has a clotting disorder he states he's never had a blood clot in his lungs or his leg he's had an aneurysm in his brain but no other abnormalities. Feel this is a low possibility for PE and most likely he had the flu earlier last week which caused him to be severely dehydrated which caused the weakness. After the 1 L of fluid patient was able to get up and walk around the ER without any weakness or shortness of breath which he states prior he could barely walk down the hall without feeling weak and short of breath.     Gwyneth Sprout, MD 03/10/11 2141

## 2011-03-10 NOTE — ED Notes (Signed)
PT ambulated with a steady gait; VSS; no signs of distress; respirations even and unlabored; no questions at this time.

## 2011-03-10 NOTE — ED Notes (Signed)
PT ambulated around ED 3 times with no signs of lightheadedness or dizziness. MD made aware.

## 2011-03-10 NOTE — ED Notes (Signed)
Assisted [place], [place] with placing pt on monitor, continuous pulse oximetry and blood pressure cuff; family at bedside

## 2011-03-12 LAB — URINE CULTURE: Culture  Setup Time: 201301032119

## 2011-03-22 ENCOUNTER — Encounter: Payer: Self-pay | Admitting: Cardiology

## 2011-03-22 ENCOUNTER — Ambulatory Visit (INDEPENDENT_AMBULATORY_CARE_PROVIDER_SITE_OTHER): Payer: Medicare Other | Admitting: Cardiology

## 2011-03-22 ENCOUNTER — Telehealth: Payer: Self-pay | Admitting: Cardiology

## 2011-03-22 VITALS — BP 105/80 | HR 82 | Ht 70.0 in | Wt 188.0 lb

## 2011-03-22 DIAGNOSIS — I4891 Unspecified atrial fibrillation: Secondary | ICD-10-CM

## 2011-03-22 DIAGNOSIS — E785 Hyperlipidemia, unspecified: Secondary | ICD-10-CM

## 2011-03-22 DIAGNOSIS — Z8679 Personal history of other diseases of the circulatory system: Secondary | ICD-10-CM

## 2011-03-22 NOTE — Assessment & Plan Note (Signed)
No recurrence. Continue Aggrenox, not Coumadin candidate history of intracranial bleed. Nothing on exam indicates obstructive disease.

## 2011-03-22 NOTE — Patient Instructions (Signed)
Your physician wants you to follow-up in:  12 months.  You will receive a reminder letter in the mail two months in advance. If you don't receive a letter, please call our office to schedule the follow-up appointment.   

## 2011-03-22 NOTE — Assessment & Plan Note (Signed)
Stable. No change in medication. Followup in one year.

## 2011-03-22 NOTE — Assessment & Plan Note (Signed)
Continue statin. All blood work with primary care.

## 2011-03-22 NOTE — Progress Notes (Signed)
HPI Ernest [name] comes in today for that which the management of his chronic A. Fib. He also has a history of hypertension and hyperlipidemia.  He developed flulike symptoms right around Christmas with fever and muscle aches. He began to have symptoms of orthostasis and was evaluated by his primary care who found his blood pressure to be low at 79/50. He was sent to emergency room at [facility] where he was thoroughly evaluated. The only abnormality was an elevated white count without shift. Chest x-ray was clear. He was given 2 L of fluid and improved.  He still weak and gets short of breath when he pushes himself. He has had no chest pain. He denies any palpitations.  Is back to his family physician and his white blood cell count has come down. He was started on medication which sounds like an antibiotic he does not have today.  He's had no symptoms of TIAs or mini strokes. He remains on Aggrenox. He is not a candidate for Coumadin because of a history of intracranial bleed. 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  Past Medical History  Diagnosis Date  . Clotting disorder   . Hyperlipidemia   . Atrial fibrillation   . Hypertension     Current Outpatient Prescriptions  Medication Sig Dispense Refill  . AGGRENOX 25-200 MG per 12 hr capsule TAKE 1 CAPSULE TWICE A DAY  3 capsule  2  . CRESTOR 5 MG tablet TAKE 1 TABLET AT BEDTIME  90 tablet  2  . metoprolol succinate (TOPROL-XL) 25 MG 24 hr tablet TAKE 1 TABLET ONCE DAILY  90 tablet  2  . Multiple Vitamin (MULTIVITAMIN) capsule Take 1 capsule by mouth daily.        . vitamin C (ASCORBIC ACID) 500 MG tablet Take 500 mg by mouth daily.        Marland Kitchen dextromethorphan-guaiFENesin (MUCINEX DM) 30-600 MG per 12 hr tablet Take 1 tablet by mouth every 12 (twelve) hours.        . Phenyleph-CPM-DM-Aspirin (ALKA-SELTZER PLUS COLD & COUGH PO) Take 1 tablet by mouth 2 (two) times daily.          Allergies  Allergen Reactions  .  Atorvastatin   . Simvastatin     Family History  Problem Relation Age of Onset  . Heart disease Father   . Hypertension Father   . Heart failure Father   . Arthritis Mother     History   Social History  . Marital Status: Married    Spouse Name: N/A    Number of Children: N/A  . Years of Education: N/A   Occupational History  . Not on file.   Social History Main Topics  . Smoking status: Former Games developer  . Smokeless tobacco: Never Used  . Alcohol Use: No  . Drug Use: No  . Sexually Active: Not on file   Other Topics Concern  . Not on file   Social History Narrative  . No narrative on file    ROS ALL NEGATIVE EXCEPT THOSE NOTED IN HPI  PE  General Appearance: well developed, well nourished in no acute distress HEENT: symmetrical face, PERRLA, good dentition  Neck: no JVD, thyromegaly, or adenopathy, trachea midline Chest: symmetric without deformity Cardiac: PMI non-displaced, regular rate and rhythm normal S1, S2, no gallop or murmur Lung: clear to ausculation and percussion Vascular: all pulses full without bruits , no carotid bruits Abdominal: nondistended, nontender, good bowel sounds, no HSM, no bruits  Extremities: no cyanosis, clubbing or edema, no sign of DVT, no varicosities  Skin: normal color, no rashes Neuro: alert and oriented x 3, non-focal Pysch: normal affect  EKG  BMET    Component Value Date/Time   NA 139 03/10/2011 1758   K 4.2 03/10/2011 1758   CL 101 03/10/2011 1758   CO2 25 03/10/2011 1758   GLUCOSE 96 03/10/2011 1758   BUN 21 03/10/2011 1758   CREATININE 1.19 03/10/2011 1758   CALCIUM 9.5 03/10/2011 1758   GFRNONAA 60* 03/10/2011 1758   GFRAA 70* 03/10/2011 1758    Lipid Panel     Component Value Date/Time   CHOL 122 10/22/2008 0956   TRIG 73.0 10/22/2008 0956   HDL 31.80* 10/22/2008 0956   CHOLHDL 4 10/22/2008 0956   VLDL 14.6 10/22/2008 0956   LDLCALC 76 10/22/2008 0956    CBC    Component Value Date/Time   WBC 16.8* 03/10/2011 1758   RBC  5.02 03/10/2011 1758   HGB 16.2 03/10/2011 1758   HCT 46.3 03/10/2011 1758   PLT 386 03/10/2011 1758   MCV 92.2 03/10/2011 1758   MCH 32.3 03/10/2011 1758   MCHC 35.0 03/10/2011 1758   RDW 11.9 03/10/2011 1758   LYMPHSABS 1.5 03/10/2011 1758   MONOABS 1.3* 03/10/2011 1758   EOSABS 0.1 03/10/2011 1758   BASOSABS 0.0 03/10/2011 1758

## 2011-03-22 NOTE — Telephone Encounter (Signed)
Walk in pt form " Pt Dropped off Prescription Renewal Form from [employer]" sent to  [facility]  03/22/11/KM

## 2011-09-22 ENCOUNTER — Emergency Department (HOSPITAL_COMMUNITY)
Admission: EM | Admit: 2011-09-22 | Discharge: 2011-09-22 | Disposition: A | Discharge status: Home / Self Care | Payer: Medicare Other | Attending: Emergency Medicine | Admitting: Emergency Medicine

## 2011-09-22 ENCOUNTER — Emergency Department (HOSPITAL_COMMUNITY): Payer: Medicare Other

## 2011-09-22 ENCOUNTER — Encounter (HOSPITAL_COMMUNITY): Payer: Self-pay | Admitting: *Deleted

## 2011-09-22 ENCOUNTER — Other Ambulatory Visit: Payer: Self-pay

## 2011-09-22 DIAGNOSIS — R5383 Other fatigue: Secondary | ICD-10-CM | POA: Insufficient documentation

## 2011-09-22 DIAGNOSIS — R5381 Other malaise: Secondary | ICD-10-CM | POA: Insufficient documentation

## 2011-09-22 DIAGNOSIS — I4891 Unspecified atrial fibrillation: Secondary | ICD-10-CM | POA: Insufficient documentation

## 2011-09-22 DIAGNOSIS — Z79899 Other long term (current) drug therapy: Secondary | ICD-10-CM | POA: Insufficient documentation

## 2011-09-22 DIAGNOSIS — R42 Dizziness and giddiness: Secondary | ICD-10-CM | POA: Insufficient documentation

## 2011-09-22 DIAGNOSIS — Z8673 Personal history of transient ischemic attack (TIA), and cerebral infarction without residual deficits: Secondary | ICD-10-CM | POA: Insufficient documentation

## 2011-09-22 DIAGNOSIS — E785 Hyperlipidemia, unspecified: Secondary | ICD-10-CM | POA: Insufficient documentation

## 2011-09-22 DIAGNOSIS — R531 Weakness: Secondary | ICD-10-CM

## 2011-09-22 DIAGNOSIS — Z9089 Acquired absence of other organs: Secondary | ICD-10-CM | POA: Insufficient documentation

## 2011-09-22 HISTORY — DX: Cerebral aneurysm, nonruptured: I67.1

## 2011-09-22 HISTORY — DX: Transient cerebral ischemic attack, unspecified: G45.9

## 2011-09-22 LAB — CBC WITH DIFFERENTIAL/PLATELET
Basophils Absolute: 0 10*3/uL (ref 0.0–0.1)
Basophils Relative: 0 % (ref 0–1)
Lymphocytes Relative: 21 % (ref 12–46)
MCHC: 33.5 g/dL (ref 30.0–36.0)
Monocytes Absolute: 0.5 10*3/uL (ref 0.1–1.0)
Neutro Abs: 2.7 10*3/uL (ref 1.7–7.7)
Platelets: 198 10*3/uL (ref 150–400)
RDW: 12.3 % (ref 11.5–15.5)
WBC: 4 10*3/uL (ref 4.0–10.5)

## 2011-09-22 LAB — URINALYSIS, ROUTINE W REFLEX MICROSCOPIC
Glucose, UA: NEGATIVE mg/dL
Ketones, ur: NEGATIVE mg/dL
Leukocytes, UA: NEGATIVE
Nitrite: NEGATIVE
Protein, ur: NEGATIVE mg/dL
pH: 5.5 (ref 5.0–8.0)

## 2011-09-22 LAB — BASIC METABOLIC PANEL
CO2: 27 mEq/L (ref 19–32)
Calcium: 9 mg/dL (ref 8.4–10.5)
Chloride: 103 mEq/L (ref 96–112)
Creatinine, Ser: 1.22 mg/dL (ref 0.50–1.35)
GFR calc Af Amer: 68 mL/min — ABNORMAL LOW (ref >90)
Sodium: 138 mEq/L (ref 135–145)

## 2011-09-22 LAB — TROPONIN I: Troponin I: <0.3 ng/mL (ref <0.30)

## 2011-09-22 NOTE — ED Notes (Signed)
Dizzy since Sunday,  Feels weak and fatigues easily.  No NVD, no fever.

## 2011-09-22 NOTE — ED Provider Notes (Signed)
History  This chart was scribed for Donnetta Hutching, MD by Bennett Scrape. This patient was seen in room [facility]11/[facility]11 and the patient's care was started at 4:46PM.  CSN: [id number]  Arrival date & time 09/22/11  1603   First MD Initiated Contact with Patient 09/22/11 1646      Chief Complaint  Patient presents with  . Dizziness    The history is provided by the patient. No language interpreter was used.    Ernest [name] is a 71 y.o. male who presents to the Emergency Department complaining of 4 days of gradual onset, gradually worsening, constant fatigue with associated SOB, weakness and dizziness. The fatigue and weakness are described as becoming tired after walking less than 200 ft. He denies staggering when walking and at baseline has no trouble walking. He states that he attributed his symptoms to hypoglycemia at first but was advised to come to the ED by his PCP today for further evaluation for hypotension. Pt reports that his BP dropped when he stood up in his PCP's office. He states that his BP is normally around 120/80. He denies having a h/o DM but reports having trouble with hypoglycemia in the past.  He reports one prior last episode of similar symptoms 4 to 5 years ago. He denies fever, neck pain, visual disturbance, CP, cough, SOB, abdominal pain, nausea, urinary symptoms, back pain, HA, numbness and rash as associated symptoms.  He is a former smoker but denies alcohol use.  Past Medical History  Diagnosis Date  . Hyperlipidemia   . Atrial fibrillation   . Brain aneurysm   . TIA (transient ischemic attack)     Past Surgical History  Procedure Date  . Appendectomy     Family History  Problem Relation Age of Onset  . Heart disease Father   . Hypertension Father   . Heart failure Father   . Arthritis Mother     History  Substance Use Topics  . Smoking status: Former Games developer  . Smokeless tobacco: Never Used  . Alcohol Use: No      Review of Systems  A complete 10  system review of systems was obtained and all systems are negative except as noted in the HPI and PMH.   Allergies  Atorvastatin and Simvastatin  Home Medications   Current Outpatient Rx  Name Route Sig Dispense Refill  . AGGRENOX 25-200 MG PO CP12  TAKE 1 CAPSULE TWICE A DAY 3 capsule 2  . CO Q 10 PO Oral Take 1 capsule by mouth every morning.    Marland Kitchen CRESTOR 5 MG PO TABS  TAKE 1 TABLET AT BEDTIME 90 tablet 2  . B-12 PO Oral Take 1 tablet by mouth every morning.    Marland Kitchen METOPROLOL SUCCINATE ER 25 MG PO TB24  TAKE 1 TABLET ONCE DAILY 90 tablet 2  . MULTIVITAMINS PO CAPS Oral Take 1 capsule by mouth daily.      Marland Kitchen VITAMIN C 500 MG PO TABS Oral Take 500 mg by mouth daily.        Triage Vitals: BP 111/64  Pulse 96  Temp 98.2 F (36.8 C) (Oral)  Resp 18  Ht 5\' 10"  (1.778 m)  Wt 187 lb (84.823 kg)  BMI 26.83 kg/m2  SpO2 100%  Physical Exam  Nursing note and vitals reviewed. Constitutional: He is oriented to person, place, and time. He appears well-developed and well-nourished. No distress.  HENT:  Head: Normocephalic and atraumatic.  Eyes: Conjunctivae and EOM are normal.  Neck: Neck supple. No tracheal deviation present.  Cardiovascular: Normal rate, regular rhythm and normal heart sounds.   Pulmonary/Chest: Effort normal and breath sounds normal. No respiratory distress.  Abdominal: Soft. He exhibits no distension.  Musculoskeletal: Normal range of motion. He exhibits no edema.  Neurological: He is alert and oriented to person, place, and time.  Skin: Skin is warm and dry.  Psychiatric: He has a normal mood and affect. His behavior is normal.    ED Course  Procedures (including critical care time)  DIAGNOSTIC STUDIES: Oxygen Saturation is 100% on room air, normal by my interpretation.    COORDINATION OF CARE: 5:06PM-Informed pt that his vitals are stable and his is not in a medical crisis. Discussed treatment plan which includes blood work and EKG with pt at bedside and pt  agreed to plan.   Labs Reviewed  BASIC METABOLIC PANEL - Abnormal; Notable for the following:    GFR calc non Af Amer 58 (*)     GFR calc Af Amer 68 (*)     All other components within normal limits  URINALYSIS, ROUTINE W REFLEX MICROSCOPIC - Abnormal; Notable for the following:    Color, Urine AMBER (*)  BIOCHEMICALS MAY BE AFFECTED BY COLOR   Specific Gravity, Urine >1.030 (*)     All other components within normal limits  CBC WITH DIFFERENTIAL  TROPONIN I   Dg Chest 2 View  09/22/2011  *RADIOLOGY REPORT*  Clinical Data: Weakness, fatigue, dizziness  CHEST - 2 VIEW  Comparison: 03/10/2011  Findings: Mild cardiomegaly noted.  Vessel seen on end or small pulmonary nodule over the right mid thorax is stable.  No new focal pulmonary opacity.  No pleural effusion.  IMPRESSION: No acute abnormality.  Mild cardiomegaly.  Original Report Authenticated By: Harrel Lemon, M.D.     No diagnosis found.   Date: 09/22/2011  Rate: 88  Rhythm: atrial fibrillation  QRS Axis: normal  Intervals: normal  ST/T Wave abnormalities: normal  Conduction Disutrbances:none  Narrative Interpretation:   Old EKG Reviewed: changes noted   MDM  Patient complains of weakness and fatigue. Wife reports normal behavior. Normal physical exam emergency department. Screening labs, EKG, chest x-ray, urinalysis, troponin all normal. Patient is ambulatory. Will discharge to primary care.    I personally performed the services described in this documentation, which was scribed in my presence. The recorded information has been reviewed and considered.      Donnetta Hutching, MD 09/22/11 2026

## 2011-09-27 ENCOUNTER — Encounter: Payer: Self-pay | Admitting: Physician Assistant

## 2011-09-27 ENCOUNTER — Ambulatory Visit (INDEPENDENT_AMBULATORY_CARE_PROVIDER_SITE_OTHER): Payer: Medicare Other | Admitting: Physician Assistant

## 2011-09-27 VITALS — BP 77/48 | HR 74 | Ht 70.0 in | Wt 183.0 lb

## 2011-09-27 DIAGNOSIS — I951 Orthostatic hypotension: Secondary | ICD-10-CM

## 2011-09-27 DIAGNOSIS — I4891 Unspecified atrial fibrillation: Secondary | ICD-10-CM

## 2011-09-27 MED ORDER — DIGOXIN 125 MCG PO TABS: 0.1250 mg | ORAL_TABLET | Freq: Every day | ORAL | Status: DC

## 2011-09-27 NOTE — Progress Notes (Signed)
[address] [place], [place]  [address] Phone: [phone] Fax:  [phone]  Date:  09/27/2011   Name:  Ernest [name]   DOB:  1940/12/01   MRN:  [id number]  PCP:  Ninfa Linden, NP  Primary Cardiologist:  Dr. Valera Castle  Primary Electrophysiologist:  None    History of Present Illness: Ernest [name] is a 71 y.o. male who returns for evaluation of weakness.  He has a history of permanent atrial fibrillation, HTN, HL, prior TIA.  Echo 08/2006: EF 50-55%, trivial AI, mild MR, moderate LAE, moderate RAE.  Nuclear study in 1998: No ischemia or scar.  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  Seen in the emergency room 09/22/11 with fatigue, dyspnea, weakness and dizziness.  He apparently had a blood pressure drop in his PCPs office.  Vital signs were stable in the emergency room.  Chest x-ray was nonacute.  O2 sats were 100% on room air.  Urinalysis, CBC, basic metabolic panel and troponin were all normal.  He's been feeling weak and dizzy for about a week.  He denies syncope or near syncope.  He denies chest pain.  He does have dyspnea with more extreme activities.  This is unusual for him.  He denies orthopnea or PND.  He denies edema.  He denies any recent vomiting or diarrhea.  Denies any melena or hematochezia.  Denies any fevers chills or cough.  Denies any recent medication changes.  Wt Readings from Last 3 Encounters:  09/27/11 183 lb (83.008 kg)  09/22/11 187 lb (84.823 kg)  03/22/11 188 lb (85.276 kg)     Potassium  Date/Time Value Range Status  09/22/2011  5:34 PM 4.0  3.5 - 5.1 mEq/L Final     Creatinine, Ser  Date/Time Value Range Status  09/22/2011  5:34 PM 1.22  0.50 - 1.35 mg/dL Final     ALT  Date/Time Value Range Status  03/10/2011  5:58 PM 18  0 - 53 U/L Final     Hemoglobin  Date/Time Value Range Status  09/22/2011  5:34 PM 14.3  13.0 - 17.0 g/dL Final    Past Medical History  Diagnosis Date  . Hyperlipidemia   .  Atrial fibrillation   . Brain aneurysm   . TIA (transient ischemic attack)     Current Outpatient Prescriptions  Medication Sig Dispense Refill  . AGGRENOX 25-200 MG per 12 hr capsule TAKE 1 CAPSULE TWICE A DAY  3 capsule  2  . Coenzyme Q10 (CO Q 10 PO) Take 1 capsule by mouth every morning.      Marland Kitchen CRESTOR 5 MG tablet TAKE 1 TABLET AT BEDTIME  90 tablet  2  . Cyanocobalamin (B-12 PO) Take 1 tablet by mouth every morning.      . metoprolol succinate (TOPROL-XL) 25 MG 24 hr tablet TAKE 1 TABLET ONCE DAILY  90 tablet  2  . Multiple Vitamin (MULTIVITAMIN) capsule Take 1 capsule by mouth daily.        . vitamin C (ASCORBIC ACID) 500 MG tablet Take 500 mg by mouth daily.          Allergies: Allergies  Allergen Reactions  . Atorvastatin Other (See Comments)    REACTION: Muscle cramps  . Simvastatin Other (See Comments)    REACTION: Muscle cramps    History  Substance Use Topics  . Smoking status: Former Smoker    Quit date: 09/26/1996  . Smokeless tobacco: Former Neurosurgeon  Quit date: 09/26/1996  . Alcohol Use: No     ROS:  Please see the history of present illness.     All other systems reviewed and negative.   PHYSICAL EXAM: VS:  BP 77/48  Pulse 74  Ht 5\' 10"  (1.778 m)  Wt 183 lb (83.008 kg)  BMI 26.26 kg/m2  Filed Vitals:   09/27/11 0937 09/27/11 0941 09/27/11 0943 09/27/11 0944  BP: 81/54 81/41 83/46  77/48  Pulse: 54 80 60 74  Height:      Weight:         Well nourished, well developed, in no acute distress HEENT: normal Neck: no JVD Cardiac:  normal S1, S2; Irregularly irregular; no murmur Lungs:  clear to auscultation bilaterally, no wheezing, rhonchi or rales Abd: soft, nontender, no hepatomegaly Ext: no edema Skin: warm and dry Neuro:  CNs 2-12 intact, no focal abnormalities noted  EKG:  Atrial fibrillation, heart rate 103, normal axis      ASSESSMENT AND PLAN:  1.  Orthostatic hypotension Etiology not entirely clear.  He does take a very low dose of  Toprol.  He has not had any recent illnesses to explain dehydration.  His labs in the emergency room the other day were normal.  Chest x-ray and Urinalysis were okay.  I reviewed his case with Dr. Jens Som (DOD).  We will decrease his Toprol to 12.5 mg daily.  He can take this in the evening.  Add digoxin 0.125 mg daily for rate control.  Check a TSH and cortisol level today.  Follow up with me in a couple of weeks.  We'll repeat his digoxin level at that time.  Also, check a 2-D echocardiogram.  2.  Atrial fibrillation Increased rate today.  Adjust medications as noted above.  He is not on Coumadin secondary to history of intracranial bleed from cerebral aneurysm.  SignedTereso Newcomer, PA-C  10:04 AM 09/27/2011

## 2011-09-27 NOTE — Patient Instructions (Addendum)
Your physician recommends that you schedule a follow-up appointment in: 10/10/11 WITH SCOTT WEAVER, PAC @ 8:50 AM  Your physician recommends that you return for lab work in: TODAY TSH, CORTISOL   Your physician has requested that you have an echocardiogram DX A-FIB. Echocardiography is a painless test that uses sound waves to create images of your heart. It provides your doctor with information about the size and shape of your heart and how well your heart's chambers and valves are working. This procedure takes approximately one hour. There are no restrictions for this procedure.   INCREASE FLUIDS 6-8 OZ EVERY 2-3 HOURS (WATER)  INCREASE SALT INTAKE SLIGHTLY PER SCOTT WEAVER, PAC  DECREASE TOPROL XL TO 12.5 MG EVERY NIGHT @ BED TIME  START DIGOXIN 0.125 MG DAILY  Your physician recommends that you return for lab work in: DIGOXIN LEVEL 10/10/11 SAME DAY YOU SEE THE PA, PLEASE MAKE SURE YOU DO NOT TAKE YOUR DIGOXIN THAT MORNINIG

## 2011-09-28 ENCOUNTER — Telehealth: Payer: Self-pay | Admitting: *Deleted

## 2011-09-28 NOTE — Telephone Encounter (Signed)
Message copied by Tarri Fuller on Wed Sep 28, 2011  1:22 PM ------      Message from: [place], [place] T      Created: Tue Sep 27, 2011  1:50 PM       Please notify patient that the lab results are ok.      Tereso Newcomer, PA-C  1:50 PM 09/27/2011

## 2011-09-28 NOTE — Telephone Encounter (Signed)
wife notified of lab results today and gave verbal understanding, verified upcoming appts in [facility] office

## 2011-10-03 ENCOUNTER — Emergency Department (HOSPITAL_COMMUNITY)
Admission: EM | Admit: 2011-10-03 | Discharge: 2011-10-04 | Disposition: A | Discharge status: Home / Self Care | Payer: Medicare Other | Attending: Emergency Medicine | Admitting: Emergency Medicine

## 2011-10-03 ENCOUNTER — Emergency Department (HOSPITAL_COMMUNITY): Payer: Medicare Other

## 2011-10-03 ENCOUNTER — Encounter (HOSPITAL_COMMUNITY): Payer: Self-pay | Admitting: *Deleted

## 2011-10-03 DIAGNOSIS — Z8673 Personal history of transient ischemic attack (TIA), and cerebral infarction without residual deficits: Secondary | ICD-10-CM | POA: Insufficient documentation

## 2011-10-03 DIAGNOSIS — R5383 Other fatigue: Secondary | ICD-10-CM | POA: Insufficient documentation

## 2011-10-03 DIAGNOSIS — R42 Dizziness and giddiness: Secondary | ICD-10-CM | POA: Insufficient documentation

## 2011-10-03 DIAGNOSIS — E785 Hyperlipidemia, unspecified: Secondary | ICD-10-CM | POA: Insufficient documentation

## 2011-10-03 DIAGNOSIS — R0602 Shortness of breath: Secondary | ICD-10-CM | POA: Insufficient documentation

## 2011-10-03 DIAGNOSIS — R5381 Other malaise: Secondary | ICD-10-CM | POA: Insufficient documentation

## 2011-10-03 DIAGNOSIS — Z79899 Other long term (current) drug therapy: Secondary | ICD-10-CM | POA: Insufficient documentation

## 2011-10-03 DIAGNOSIS — Z9089 Acquired absence of other organs: Secondary | ICD-10-CM | POA: Insufficient documentation

## 2011-10-03 DIAGNOSIS — I4891 Unspecified atrial fibrillation: Secondary | ICD-10-CM | POA: Insufficient documentation

## 2011-10-03 DIAGNOSIS — R079 Chest pain, unspecified: Secondary | ICD-10-CM | POA: Insufficient documentation

## 2011-10-03 LAB — BASIC METABOLIC PANEL
CO2: 27 mEq/L (ref 19–32)
Glucose, Bld: 101 mg/dL — ABNORMAL HIGH (ref 70–99)
Potassium: 4.6 mEq/L (ref 3.5–5.1)
Sodium: 140 mEq/L (ref 135–145)

## 2011-10-03 LAB — URINALYSIS, ROUTINE W REFLEX MICROSCOPIC
Ketones, ur: 15 mg/dL — AB
Leukocytes, UA: NEGATIVE
Nitrite: NEGATIVE
Specific Gravity, Urine: 1.02 (ref 1.005–1.030)
Urobilinogen, UA: 0.2 mg/dL (ref 0.0–1.0)
pH: 5.5 (ref 5.0–8.0)

## 2011-10-03 LAB — CBC WITH DIFFERENTIAL/PLATELET
Lymphocytes Relative: 11 % — ABNORMAL LOW (ref 12–46)
Lymphs Abs: 1.6 10*3/uL (ref 0.7–4.0)
Neutro Abs: 11.8 10*3/uL — ABNORMAL HIGH (ref 1.7–7.7)
Neutrophils Relative %: 82 % — ABNORMAL HIGH (ref 43–77)
Platelets: 345 10*3/uL (ref 150–400)
RBC: 4.91 MIL/uL (ref 4.22–5.81)
WBC: 14.4 10*3/uL — ABNORMAL HIGH (ref 4.0–10.5)

## 2011-10-03 LAB — DIGOXIN LEVEL: Digoxin Level: 0.5 ng/mL — ABNORMAL LOW (ref 0.8–2.0)

## 2011-10-03 LAB — POCT I-STAT TROPONIN I: Troponin i, poc: 0 ng/mL (ref 0.00–0.08)

## 2011-10-03 NOTE — Progress Notes (Signed)
11:49 PM Results for orders placed during the hospital encounter of 10/03/11  CBC WITH DIFFERENTIAL      Component Value Range   WBC 14.4 (*) 4.0 - 10.5 K/uL   RBC 4.91  4.22 - 5.81 MIL/uL   Hemoglobin 15.2  13.0 - 17.0 g/dL   HCT 78.2  95.6 - 21.3 %   MCV 91.9  78.0 - 100.0 fL   MCH 31.0  26.0 - 34.0 pg   MCHC 33.7  30.0 - 36.0 g/dL   RDW 08.6  57.8 - 46.9 %   Platelets 345  150 - 400 K/uL   Neutrophils Relative 82 (*) 43 - 77 %   Neutro Abs 11.8 (*) 1.7 - 7.7 K/uL   Lymphocytes Relative 11 (*) 12 - 46 %   Lymphs Abs 1.6  0.7 - 4.0 K/uL   Monocytes Relative 6  3 - 12 %   Monocytes Absolute 0.9  0.1 - 1.0 K/uL   Eosinophils Relative 1  0 - 5 %   Eosinophils Absolute 0.1  0.0 - 0.7 K/uL   Basophils Relative 0  0 - 1 %   Basophils Absolute 0.0  0.0 - 0.1 K/uL  BASIC METABOLIC PANEL      Component Value Range   Sodium 140  135 - 145 mEq/L   Potassium 4.6  3.5 - 5.1 mEq/L   Chloride 103  96 - 112 mEq/L   CO2 27  19 - 32 mEq/L   Glucose, Bld 101 (*) 70 - 99 mg/dL   BUN 15  6 - 23 mg/dL   Creatinine, Ser 6.29  0.50 - 1.35 mg/dL   Calcium 52.8  8.4 - 41.3 mg/dL   GFR calc non Af Amer 64 (*) >90 mL/min   GFR calc Af Amer 74 (*) >90 mL/min  POCT I-STAT TROPONIN I      Component Value Range   Troponin i, poc 0.00  0.00 - 0.08 ng/mL   Comment 3           URINALYSIS, ROUTINE W REFLEX MICROSCOPIC      Component Value Range   Color, Urine YELLOW  YELLOW   APPearance CLEAR  CLEAR   Specific Gravity, Urine 1.020  1.005 - 1.030   pH 5.5  5.0 - 8.0   Glucose, UA NEGATIVE  NEGATIVE mg/dL   Hgb urine dipstick NEGATIVE  NEGATIVE   Bilirubin Urine NEGATIVE  NEGATIVE   Ketones, ur 15 (*) NEGATIVE mg/dL   Protein, ur NEGATIVE  NEGATIVE mg/dL   Urobilinogen, UA 0.2  0.0 - 1.0 mg/dL   Nitrite NEGATIVE  NEGATIVE   Leukocytes, UA NEGATIVE  NEGATIVE  TROPONIN I      Component Value Range   Troponin I <0.30  <0.30 ng/mL  DIGOXIN LEVEL      Component Value Range   Digoxin Level 0.5 (*) 0.8  - 2.0 ng/mL   Dg Chest 2 View  10/03/2011  *RADIOLOGY REPORT*  Clinical Data: Shortness of breath  CHEST - 2 VIEW  Comparison: 09/22/2011  Findings: Cardiomediastinal silhouette is stable.  No acute infiltrate or pleural effusion.  No pulmonary edema.  Stable probable small granuloma right midlung.  Stable mild degenerative changes lower thoracic spine.  IMPRESSION: No active disease.  No significant change.  Original Report Authenticated By: Natasha Mead, M.D.   Dg Chest 2 View  09/22/2011  *RADIOLOGY REPORT*  Clinical Data: Weakness, fatigue, dizziness  CHEST - 2 VIEW  Comparison: 03/10/2011  Findings: Mild cardiomegaly  noted.  Vessel seen on end or small pulmonary nodule over the right mid thorax is stable.  No new focal pulmonary opacity.  No pleural effusion.  IMPRESSION: No acute abnormality.  Mild cardiomegaly.  Original Report Authenticated By: Harrel Lemon, M.D.    Pt's lab tests show no reason for his weakness.  I have discussed his case with both the [place] cardiologist and with Dr. Toniann Fail, the [facility], and neither finds a reason for him to be admitted.  He was given a copy of his lab results to take to his cardiology visit in the morning for echocardiogram.  I discussed his findings with him and with his wife, and they understand his situation.

## 2011-10-03 NOTE — Progress Notes (Signed)
9:09 PM Pt is a 71 year old man who has had progressive weakness over the past two or three weeks.  He was seen at [facility] 10 days ago, and subsequently was seen by [facility], who put him on digoxin and decreased his metoprolol.  He is scheduled for echocardiogram tomorrow.  Today hd had vague chest pain, called in to [facility], and was advised to come to the ED.  Exam now shows him to be in no distress at rest. He has an irregular heart beat.  Lungs clear, abdomen soft and nontender.  EKG shows AF, no change.  Lab tests show WBC 14,400 with 82% neutrophils.  Chemistries were normal  Chest x-ray is negative. This is his third visit in 2 weeks for these symptoms.  Call to [facility] --> Lab workup has shown no cardiac reason for his weakness.  Getting UA to check for UTI.   11:49 PM  Results for orders placed during the hospital encounter of 10/03/11   CBC WITH DIFFERENTIAL   Component  Value  Range    WBC  14.4 (*)  4.0 - 10.5 K/uL    RBC  4.91  4.22 - 5.81 MIL/uL    Hemoglobin  15.2  13.0 - 17.0 g/dL    HCT  16.1  09.6 - 04.5 %    MCV  91.9  78.0 - 100.0 fL    MCH  31.0  26.0 - 34.0 pg    MCHC  33.7  30.0 - 36.0 g/dL    RDW  40.9  81.1 - 91.4 %    Platelets  345  150 - 400 K/uL    Neutrophils Relative  82 (*)  43 - 77 %    Neutro Abs  11.8 (*)  1.7 - 7.7 K/uL    Lymphocytes Relative  11 (*)  12 - 46 %    Lymphs Abs  1.6  0.7 - 4.0 K/uL    Monocytes Relative  6  3 - 12 %    Monocytes Absolute  0.9  0.1 - 1.0 K/uL    Eosinophils Relative  1  0 - 5 %    Eosinophils Absolute  0.1  0.0 - 0.7 K/uL    Basophils Relative  0  0 - 1 %    Basophils Absolute  0.0  0.0 - 0.1 K/uL   BASIC METABOLIC PANEL   Component  Value  Range    Sodium  140  135 - 145 mEq/L    Potassium  4.6  3.5 - 5.1 mEq/L    Chloride  103  96 - 112 mEq/L    CO2  27  19 - 32 mEq/L    Glucose, Bld  101 (*)  70 - 99 mg/dL    BUN  15  6 - 23 mg/dL    Creatinine, Ser  7.82  0.50 - 1.35 mg/dL    Calcium  95.6  8.4 - 10.5 mg/dL    GFR calc non Af Amer  64 (*)  >90 mL/min    GFR calc Af Amer  74 (*)  >90 mL/min   POCT I-STAT TROPONIN I   Component  Value  Range    Troponin i, poc  0.00  0.00 - 0.08 ng/mL    Comment 3     URINALYSIS, ROUTINE W REFLEX MICROSCOPIC   Component  Value  Range    Color, Urine  YELLOW  YELLOW    APPearance  CLEAR  CLEAR    Specific Gravity, Urine  1.020  1.005 - 1.030    pH  5.5  5.0 - 8.0    Glucose, UA  NEGATIVE  NEGATIVE mg/dL    Hgb urine dipstick  NEGATIVE  NEGATIVE    Bilirubin Urine  NEGATIVE  NEGATIVE    Ketones, ur  15 (*)  NEGATIVE mg/dL    Protein, ur  NEGATIVE  NEGATIVE mg/dL    Urobilinogen, UA  0.2  0.0 - 1.0 mg/dL    Nitrite  NEGATIVE  NEGATIVE    Leukocytes, UA  NEGATIVE  NEGATIVE   TROPONIN I   Component  Value  Range    Troponin I  <0.30  <0.30 ng/mL   DIGOXIN LEVEL   Component  Value  Range    Digoxin Level  0.5 (*)  0.8 - 2.0 ng/mL    Dg Chest 2 View  10/03/2011 *RADIOLOGY REPORT* Clinical Data: Shortness of breath CHEST - 2 VIEW Comparison: 09/22/2011 Findings: Cardiomediastinal silhouette is stable. No acute infiltrate or pleural effusion. No pulmonary edema. Stable probable small granuloma right midlung. Stable mild degenerative changes lower thoracic spine. IMPRESSION: No active disease. No significant change. Original Report Authenticated By: Natasha Mead, M.D.  Dg Chest 2 View  09/22/2011 *RADIOLOGY REPORT* Clinical Data: Weakness, fatigue, dizziness CHEST - 2 VIEW Comparison: 03/10/2011 Findings: Mild cardiomegaly noted. Vessel seen on end or small pulmonary nodule over the right mid thorax is stable. No new focal pulmonary opacity. No pleural effusion. IMPRESSION: No acute abnormality. Mild cardiomegaly. Original Report Authenticated By: Harrel Lemon, M.D.  Pt's lab tests show no reason for his weakness. I have discussed his case with both the [place] cardiologist and with Dr. Toniann Fail, the [facility], and neither  finds a reason for him to be admitted. He was given a copy of his lab results to take to his cardiology visit in the morning for echocardiogram. I discussed his findings with him and with his wife, and they understand his situation.

## 2011-10-03 NOTE — ED Notes (Signed)
Complaining of weakness, dizziness, tired and SOB. States started approximately two weeks ago. Similar symptoms in January. However feels they are worst this time.

## 2011-10-03 NOTE — ED Provider Notes (Signed)
9:06 PM  Date: 10/03/2011  Rate: 105  Rhythm: atrial fibrillation  QRS Axis: normal  Intervals: normal QRS:  Poor R wave progression in precordial leads suggests possible old anterior myocardial infarction.  ST/T Wave abnormalities: nonspecific ST changes and nonspecific T wave changes  Conduction Disutrbances:none  Narrative Interpretation: Abnormal EKG  Old EKG Reviewed: none available      Carleene Cooper III, MD 10/03/11 2108

## 2011-10-03 NOTE — ED Provider Notes (Signed)
10:33 PM Assumed care of patient from Robyn and Dr. Ignacia Palma.  Patient presents today with a chief complaint of weakness and shortness of breath.  Cardiology had been consulted prior to patient's arrival in the CDU.  Cardiology recommended getting another troponin and a Digoxin level on the patient.  If both are normal, patient can be discharged home with Cardiology follow up.  10:45 PM Second troponin negative.  Digoxin level 0.5.  Discussed with Dr. Ignacia Palma.  He states that he will talk with Hospitalist and call back.  Reassessed patient.  No acute distress, Alert and orientated x 3, Heart irregular rhythm, lungs CTAB.    Dr. Ignacia Palma has discussed patient with hospitalist who also feel that patient can be discharged.  Will discharge patient home.  Dr. Ignacia Palma has discussed results of the work up with patient.    Ernest [name] [place], PA-C 10/05/11 [phone]

## 2011-10-03 NOTE — ED Provider Notes (Signed)
History     CSN: [id number]  Arrival date & time 10/03/11  1741   First MD Initiated Contact with Patient 10/03/11 2021      Chief Complaint  Patient presents with  . Shortness of Breath  . Weakness  . Chest Pain  . Dizziness    (Consider location/radiation/quality/duration/timing/severity/associated sxs/prior treatment) HPI Comments: 71 y/o male with continuing weakness since being seen at [facility] on 7/18. Patient states they ran a bunch of tests which all came back normal. He saw his cardiologist last week who scheduled an echocardiogram for his afib tomorrow. About 2 1/2 hours ago patient became short of breath. Symptoms worse with exertion but still present at rest. Denies any severe acute chest pain, but does have a slight feeling of discomfort over left side of chest that does not radiate. Also feels lightheaded and feels as if his vision may be a little blurred. Denies any leg swelling, headache, or history of HTN. States he has been keeping himself well hydrated as advised at his last ED visit and has not been spending time in the heat. Denies any fever, diaphoresis, nausea, vomiting.  Patient is a 71 y.o. male presenting with shortness of breath, weakness, and chest pain. The history is provided by the patient and the spouse.  Shortness of Breath  Associated symptoms include chest pain and shortness of breath. Pertinent negatives include no fever.  Weakness Primary symptoms do not include headaches, fever, nausea or vomiting.  Additional symptoms include weakness.  Chest Pain Primary symptoms include shortness of breath. Pertinent negatives for primary symptoms include no fever, no nausea and no vomiting.  Associated symptoms include weakness.  Pertinent negatives for associated symptoms include no diaphoresis.     Past Medical History  Diagnosis Date  . Hyperlipidemia   . Atrial fibrillation   . Brain aneurysm   . TIA (transient ischemic attack)     Past Surgical  History  Procedure Date  . Appendectomy     Family History  Problem Relation Age of Onset  . Heart disease Father   . Hypertension Father   . Heart failure Father   . Arthritis Mother     History  Substance Use Topics  . Smoking status: Former Smoker    Quit date: 09/26/1996  . Smokeless tobacco: Former Neurosurgeon    Quit date: 09/26/1996  . Alcohol Use: No      Review of Systems  Constitutional: Negative for fever and diaphoresis.  Respiratory: Positive for shortness of breath.   Cardiovascular: Positive for chest pain. Negative for leg swelling.  Gastrointestinal: Negative for nausea and vomiting.  Neurological: Positive for weakness. Negative for headaches.    Allergies  Atorvastatin and Simvastatin  Home Medications   Current Outpatient Rx  Name Route Sig Dispense Refill  . AGGRENOX 25-200 MG PO CP12  TAKE 1 CAPSULE TWICE A DAY 3 capsule 2  . CO Q 10 PO Oral Take 1 capsule by mouth every morning.    Marland Kitchen CRESTOR 5 MG PO TABS  TAKE 1 TABLET AT BEDTIME 90 tablet 2  . B-12 PO Oral Take 1 tablet by mouth every morning.    Marland Kitchen DIGOXIN 0.125 MG PO TABS Oral Take 1 tablet (0.125 mg total) by mouth daily. 30 tablet 11  . METOPROLOL SUCCINATE ER 25 MG PO TB24 Oral Take 0.5 tablets (12.5 mg total) by mouth at bedtime.    . MULTIVITAMINS PO CAPS Oral Take 1 capsule by mouth daily.      Marland Kitchen  VITAMIN C 500 MG PO TABS Oral Take 500 mg by mouth daily.        BP 118/74  Pulse 101  Temp 97.4 F (36.3 C) (Oral)  Resp 17  SpO2 95%  Physical Exam  Nursing note and vitals reviewed. Constitutional: He is oriented to person, place, and time. He appears well-developed and well-nourished. No distress.  HENT:  Head: Normocephalic and atraumatic.  Mouth/Throat: Oropharynx is clear and moist.  Eyes: Conjunctivae and EOM are normal. Pupils are equal, round, and reactive to light.  Cardiovascular: Normal rate and intact distal pulses.  An irregularly irregular rhythm present.       No pitting  edema.  Pulmonary/Chest: Effort normal and breath sounds normal. No respiratory distress.  Abdominal: Soft. Bowel sounds are normal. There is no tenderness.  Neurological: He is alert and oriented to person, place, and time.  Skin: Skin is warm.  Psychiatric: He has a normal mood and affect. His behavior is normal.    ED Course  Procedures (including critical care time)  Labs Reviewed  CBC WITH DIFFERENTIAL - Abnormal; Notable for the following:    WBC 14.4 (*)     Neutrophils Relative 82 (*)     Neutro Abs 11.8 (*)     Lymphocytes Relative 11 (*)     All other components within normal limits  BASIC METABOLIC PANEL - Abnormal; Notable for the following:    Glucose, Bld 101 (*)     GFR calc non Af Amer 64 (*)     GFR calc Af Amer 74 (*)     All other components within normal limits  URINALYSIS, ROUTINE W REFLEX MICROSCOPIC - Abnormal; Notable for the following:    Ketones, ur 15 (*)     All other components within normal limits  POCT I-STAT TROPONIN I  TROPONIN I  DIGOXIN LEVEL   Results for orders placed during the hospital encounter of 10/03/11  CBC WITH DIFFERENTIAL      Component Value Range   WBC 14.4 (*) 4.0 - 10.5 K/uL   RBC 4.91  4.22 - 5.81 MIL/uL   Hemoglobin 15.2  13.0 - 17.0 g/dL   HCT 16.1  09.6 - 04.5 %   MCV 91.9  78.0 - 100.0 fL   MCH 31.0  26.0 - 34.0 pg   MCHC 33.7  30.0 - 36.0 g/dL   RDW 40.9  81.1 - 91.4 %   Platelets 345  150 - 400 K/uL   Neutrophils Relative 82 (*) 43 - 77 %   Neutro Abs 11.8 (*) 1.7 - 7.7 K/uL   Lymphocytes Relative 11 (*) 12 - 46 %   Lymphs Abs 1.6  0.7 - 4.0 K/uL   Monocytes Relative 6  3 - 12 %   Monocytes Absolute 0.9  0.1 - 1.0 K/uL   Eosinophils Relative 1  0 - 5 %   Eosinophils Absolute 0.1  0.0 - 0.7 K/uL   Basophils Relative 0  0 - 1 %   Basophils Absolute 0.0  0.0 - 0.1 K/uL  BASIC METABOLIC PANEL      Component Value Range   Sodium 140  135 - 145 mEq/L   Potassium 4.6  3.5 - 5.1 mEq/L   Chloride 103  96 - 112  mEq/L   CO2 27  19 - 32 mEq/L   Glucose, Bld 101 (*) 70 - 99 mg/dL   BUN 15  6 - 23 mg/dL   Creatinine, Ser 7.82  0.50 -  1.35 mg/dL   Calcium 16.1  8.4 - 09.6 mg/dL   GFR calc non Af Amer 64 (*) >90 mL/min   GFR calc Af Amer 74 (*) >90 mL/min  POCT I-STAT TROPONIN I      Component Value Range   Troponin i, poc 0.00  0.00 - 0.08 ng/mL   Comment 3             Dg Chest 2 View  10/03/2011  *RADIOLOGY REPORT*  Clinical Data: Shortness of breath  CHEST - 2 VIEW  Comparison: 09/22/2011  Findings: Cardiomediastinal silhouette is stable.  No acute infiltrate or pleural effusion.  No pulmonary edema.  Stable probable small granuloma right midlung.  Stable mild degenerative changes lower thoracic spine.  IMPRESSION: No active disease.  No significant change.  Original Report Authenticated By: Natasha Mead, M.D.     No diagnosis found.    MDM  9:53 PM Patient transferred to CDU. Discussed plan of care with Heather VanWingen, PA-C.        Trevor Mace, PA-C 10/03/11 2154  Trevor Mace, PA-C 10/03/11 2233

## 2011-10-03 NOTE — ED Notes (Signed)
Pt reports 3 week hx of weakness and fatigue, reports went to [facility] for same. States for last couple days sob and then today had intermittent left sided chest pain. States very minor. Denies chest pain at this time.

## 2011-10-04 ENCOUNTER — Ambulatory Visit (HOSPITAL_COMMUNITY): Payer: Medicare Other | Attending: Cardiology

## 2011-10-04 DIAGNOSIS — I10 Essential (primary) hypertension: Secondary | ICD-10-CM | POA: Insufficient documentation

## 2011-10-04 DIAGNOSIS — I4891 Unspecified atrial fibrillation: Secondary | ICD-10-CM | POA: Insufficient documentation

## 2011-10-04 DIAGNOSIS — I379 Nonrheumatic pulmonary valve disorder, unspecified: Secondary | ICD-10-CM | POA: Insufficient documentation

## 2011-10-04 DIAGNOSIS — E785 Hyperlipidemia, unspecified: Secondary | ICD-10-CM | POA: Insufficient documentation

## 2011-10-04 DIAGNOSIS — I079 Rheumatic tricuspid valve disease, unspecified: Secondary | ICD-10-CM | POA: Insufficient documentation

## 2011-10-04 DIAGNOSIS — I059 Rheumatic mitral valve disease, unspecified: Secondary | ICD-10-CM | POA: Insufficient documentation

## 2011-10-04 NOTE — ED Provider Notes (Signed)
Medical screening examination/treatment/procedure(s) were conducted as a shared visit with non-physician practitioner(s) and myself.  I personally evaluated the patient during the encounter 11:49 PM  Results for orders placed during the hospital encounter of 10/03/11   CBC WITH DIFFERENTIAL   Component  Value  Range    WBC  14.4 (*)  4.0 - 10.5 K/uL    RBC  4.91  4.22 - 5.81 MIL/uL    Hemoglobin  15.2  13.0 - 17.0 g/dL    HCT  16.1  09.6 - 04.5 %    MCV  91.9  78.0 - 100.0 fL    MCH  31.0  26.0 - 34.0 pg    MCHC  33.7  30.0 - 36.0 g/dL    RDW  40.9  81.1 - 91.4 %    Platelets  345  150 - 400 K/uL    Neutrophils Relative  82 (*)  43 - 77 %    Neutro Abs  11.8 (*)  1.7 - 7.7 K/uL    Lymphocytes Relative  11 (*)  12 - 46 %    Lymphs Abs  1.6  0.7 - 4.0 K/uL    Monocytes Relative  6  3 - 12 %    Monocytes Absolute  0.9  0.1 - 1.0 K/uL    Eosinophils Relative  1  0 - 5 %    Eosinophils Absolute  0.1  0.0 - 0.7 K/uL    Basophils Relative  0  0 - 1 %    Basophils Absolute  0.0  0.0 - 0.1 K/uL   BASIC METABOLIC PANEL   Component  Value  Range    Sodium  140  135 - 145 mEq/L    Potassium  4.6  3.5 - 5.1 mEq/L    Chloride  103  96 - 112 mEq/L    CO2  27  19 - 32 mEq/L    Glucose, Bld  101 (*)  70 - 99 mg/dL    BUN  15  6 - 23 mg/dL    Creatinine, Ser  7.82  0.50 - 1.35 mg/dL    Calcium  95.6  8.4 - 10.5 mg/dL    GFR calc non Af Amer  64 (*)  >90 mL/min    GFR calc Af Amer  74 (*)  >90 mL/min   POCT I-STAT TROPONIN I   Component  Value  Range    Troponin i, poc  0.00  0.00 - 0.08 ng/mL    Comment 3     URINALYSIS, ROUTINE W REFLEX MICROSCOPIC   Component  Value  Range    Color, Urine  YELLOW  YELLOW    APPearance  CLEAR  CLEAR    Specific Gravity, Urine  1.020  1.005 - 1.030    pH  5.5  5.0 - 8.0    Glucose, UA  NEGATIVE  NEGATIVE mg/dL    Hgb urine dipstick  NEGATIVE  NEGATIVE    Bilirubin Urine  NEGATIVE  NEGATIVE    Ketones, ur  15 (*)  NEGATIVE mg/dL    Protein, ur   NEGATIVE  NEGATIVE mg/dL    Urobilinogen, UA  0.2  0.0 - 1.0 mg/dL    Nitrite  NEGATIVE  NEGATIVE    Leukocytes, UA  NEGATIVE  NEGATIVE   TROPONIN I   Component  Value  Range    Troponin I  <0.30  <0.30 ng/mL   DIGOXIN LEVEL   Component  Value  Range  Digoxin Level  0.5 (*)  0.8 - 2.0 ng/mL    Dg Chest 2 View  10/03/2011 *RADIOLOGY REPORT* Clinical Data: Shortness of breath CHEST - 2 VIEW Comparison: 09/22/2011 Findings: Cardiomediastinal silhouette is stable. No acute infiltrate or pleural effusion. No pulmonary edema. Stable probable small granuloma right midlung. Stable mild degenerative changes lower thoracic spine. IMPRESSION: No active disease. No significant change. Original Report Authenticated By: Natasha Mead, M.D.  Dg Chest 2 View  09/22/2011 *RADIOLOGY REPORT* Clinical Data: Weakness, fatigue, dizziness CHEST - 2 VIEW Comparison: 03/10/2011 Findings: Mild cardiomegaly noted. Vessel seen on end or small pulmonary nodule over the right mid thorax is stable. No new focal pulmonary opacity. No pleural effusion. IMPRESSION: No acute abnormality. Mild cardiomegaly. Original Report Authenticated By: Harrel Lemon, M.D.  Pt's lab tests show no reason for his weakness. I have discussed his case with both the [place] cardiologist and with Dr. Toniann Fail, the [facility], and neither finds a reason for him to be admitted. He was given a copy of his lab results to take to his cardiology visit in the morning for echocardiogram. I discussed his findings with him and with his wife, and they understand his situation.   Carleene Cooper III, MD 10/04/11 1125

## 2011-10-04 NOTE — Progress Notes (Signed)
Echocardiogram performed.  

## 2011-10-05 ENCOUNTER — Telehealth: Payer: Self-pay | Admitting: *Deleted

## 2011-10-05 NOTE — ED Provider Notes (Signed)
Medical screening examination/treatment/procedure(s) were conducted as a shared visit with non-physician practitioner(s) and myself.  I personally evaluated the patient during the encounter Pt with 3 week history of weakness, known AF, workup shows no obvious cause of his condition.  He has an appointment for ECHO in AM at [facility].  Advised and released.  Carleene Cooper III, MD 10/05/11 [phone]

## 2011-10-05 NOTE — Telephone Encounter (Signed)
pt notified of echo results today and gave me verbal understanding today

## 2011-10-05 NOTE — Telephone Encounter (Signed)
Message copied by Tarri Fuller on Wed Oct 05, 2011  4:15 PM ------      Message from: [place], [place] T      Created: Wed Oct 05, 2011  1:25 PM       Normal LV function.      Biatrial enlargement.      Mild mitral regurgitation.      Stable echo.      Tereso Newcomer, PA-C  1:25 PM 10/05/2011

## 2011-10-10 ENCOUNTER — Other Ambulatory Visit (INDEPENDENT_AMBULATORY_CARE_PROVIDER_SITE_OTHER): Payer: Medicare Other

## 2011-10-10 ENCOUNTER — Ambulatory Visit (INDEPENDENT_AMBULATORY_CARE_PROVIDER_SITE_OTHER): Payer: Medicare Other | Admitting: Physician Assistant

## 2011-10-10 ENCOUNTER — Encounter: Payer: Self-pay | Admitting: Physician Assistant

## 2011-10-10 VITALS — BP 104/73 | HR 90 | Ht 70.0 in | Wt 181.0 lb

## 2011-10-10 DIAGNOSIS — R0989 Other specified symptoms and signs involving the circulatory and respiratory systems: Secondary | ICD-10-CM

## 2011-10-10 DIAGNOSIS — I959 Hypotension, unspecified: Secondary | ICD-10-CM

## 2011-10-10 DIAGNOSIS — R079 Chest pain, unspecified: Secondary | ICD-10-CM

## 2011-10-10 DIAGNOSIS — R5381 Other malaise: Secondary | ICD-10-CM

## 2011-10-10 DIAGNOSIS — I4891 Unspecified atrial fibrillation: Secondary | ICD-10-CM

## 2011-10-10 DIAGNOSIS — R531 Weakness: Secondary | ICD-10-CM

## 2011-10-10 LAB — D-DIMER, QUANTITATIVE: D-Dimer, Quant: 0.46 ug/mL-FEU (ref 0.00–0.48)

## 2011-10-10 NOTE — Patient Instructions (Addendum)
Your physician has requested that you have a lexiscan myoview. For further information please visit [email or web address] Please follow instruction sheet, as given.  Your physician recommends that you return for lab work in: today d dimer  Increase salt intake to raise blood pressure  Your physician recommends that you schedule a follow-up appointment in: two weeks with Dr. Daleen Squibb

## 2011-10-10 NOTE — Progress Notes (Signed)
[address] [place], [place]  [address] Phone: [phone] Fax:  [phone]  Date:  10/10/2011   Name:  Akiem Urieta   DOB:  [birth year]   MRN:  [id number]  PCP:  Ninfa Linden, NP  Primary Cardiologist:  Dr. Valera Castle  Primary Electrophysiologist:  None    History of Present Illness: Ronnel Zuercher is a 71 y.o. male who returns for follow up.  He has a history of permanent atrial fibrillation, HTN, HL, prior TIA, nonobstructive CAD.  Nuclear study in 1998: No ischemia or scar.  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    He was seen in the emergency room 09/22/11 with fatigue, dyspnea, weakness and dizziness.  He apparently had a blood pressure drop in his PCPs office.  Vital signs were stable in the emergency room.  Chest x-ray was nonacute.  O2 sats were 100% on room air.  Urinalysis, CBC, basic metabolic panel and troponin were all normal.  I saw him 7/23. He complained of feeling weak and dizzy. He was hypotensive. Blood pressure was 77/48. I decreased his Toprol. I added digoxin to ensure his rate was controlled.  Cortisol and TSH were both normal.    Echo 10/04/11: mild LVH, EF 55-60%, mild MR, mod to severe LAE, mod RAE, PASP 23.    Since I saw him, he was seen in the emergency room 10/03/11 with weakness, dyspnea and chest pain.  Blood pressure was 118/74 in the emergency room.  Chest x-ray was normal.  Hemoglobin, creatinine, potassium and digoxin levels were all normal. Troponin was also negative. Urinalysis was normal.  He feels about the same. He notes dyspnea with activity. He also notes dizziness with activity. He describes probable near syncope with activity as well. He also has some chest tightness with exertion. This has been noticeable over the last 3 weeks. He never had this before. He denies any radiating symptoms, associated nausea or diaphoresis. He denies syncope. He denies orthopnea, PND or edema. He probably describes class  IIb-?3 symptoms. He has been increasing his fluid and salt intake  Wt Readings from Last 3 Encounters:  10/10/11 181 lb (82.101 kg)  09/27/11 183 lb (83.008 kg)  09/22/11 187 lb (84.823 kg)     Potassium  Date/Time Value Range Status  10/03/2011  5:59 PM 4.6  3.5 - 5.1 mEq/L Final   Creatinine, Ser  Date/Time Value Range Status  10/03/2011  5:59 PM 1.13  0.50 - 1.35 mg/dL Final   ALT  Date/Time Value Range Status  03/10/2011  5:58 PM 18  0 - 53 U/L Final   TSH  Date/Time Value Range Status  09/27/2011 10:51 AM 3.73  0.35 - 5.50 uIU/mL Final   Hemoglobin  Date/Time Value Range Status  10/03/2011  5:59 PM 15.2  13.0 - 17.0 g/dL Final    Past Medical History  Diagnosis Date  . Hyperlipidemia   . Atrial fibrillation   . Brain aneurysm   . TIA (transient ischemic attack)     Current Outpatient Prescriptions  Medication Sig Dispense Refill  . AGGRENOX 25-200 MG per 12 hr capsule TAKE 1 CAPSULE TWICE A DAY  3 capsule  2  . Coenzyme Q10 (CO Q 10 PO) Take 1 capsule by mouth every morning.      Marland Kitchen CRESTOR 5 MG tablet TAKE 1 TABLET AT BEDTIME  90 tablet  2  . Cyanocobalamin (B-12 PO) Take 1 tablet by mouth every morning.      Marland Kitchen  digoxin (LANOXIN) 0.125 MG tablet Take 1 tablet (0.125 mg total) by mouth daily.  30 tablet  11  . metoprolol succinate (TOPROL-XL) 25 MG 24 hr tablet Take 0.5 tablets (12.5 mg total) by mouth at bedtime.      . Multiple Vitamin (MULTIVITAMIN) capsule Take 1 capsule by mouth daily.        . vitamin C (ASCORBIC ACID) 500 MG tablet Take 500 mg by mouth daily.          Allergies: Allergies  Allergen Reactions  . Atorvastatin Other (See Comments)    REACTION: Muscle cramps  . Simvastatin Other (See Comments)    REACTION: Muscle cramps    History  Substance Use Topics  . Smoking status: Former Smoker    Quit date: 09/26/1996  . Smokeless tobacco: Former Neurosurgeon    Quit date: 09/26/1996  . Alcohol Use: No     ROS:  Please see the history of present  illness.     All other systems reviewed and negative.   PHYSICAL EXAM: VS:  BP 104/73  Pulse 90  Ht 5\' 10"  (1.778 m)  Wt 181 lb (82.101 kg)  BMI 25.97 kg/m2  Filed Vitals:   10/10/11 0847 10/10/11 0848 10/10/11 0850 10/10/11 0854  BP: 102/70 97/66 114/82 104/73  Pulse: 94 119 60 90  Height:      Weight:         Well nourished, well developed, in no acute distress HEENT: normal Neck: no JVD Cardiac:  normal S1, S2; Irregularly irregular; no murmur Lungs:  clear to auscultation bilaterally, no wheezing, rhonchi or rales Abd: soft, nontender, no hepatomegaly Ext: no edema Skin: warm and dry Neuro:  CNs 2-12 intact, no focal abnormalities noted  EKG:  Atrial fibrillation, heart rate 80, normal axis      ASSESSMENT AND PLAN:  1. Chest Pain He now has symptoms of chest discomfort. His chart indicates a history of nonobstructive CAD. I cannot locate cardiac catheterization report. His last nuclear study was in 1998. We discussed further evaluation of his symptoms including cardiac catheterization versus stress testing. I then discussed this with Dr. Patty Sermons (DOD).  We reviewed his records and his ECG from today. We decided to start with a nuclear study. I will arrange a Lexiscan Myoview. I will also arrange for him to have a d-dimer drawn. If this is abnormal, he will need a chest CT.  Followup with Dr. Daleen Squibb on the next 2-3 weeks.  2. Hypotension Workup thus far has been normal. He may simply just have autonomic insufficiency. His blood pressure does look better with the medication adjustments made last time. His heart rate is controlled. Continue current therapy. I've asked him to continue to try to increase his fluid and salt intake to help with his low blood pressures.  3. Weakness  He has been bitten by several ticks the summer. He denies myalgias or arthralgias or rash. However, he is concerned about Lyme disease. I will go ahead and set him up for a Lyme titer.  4. Atrial  Fibrillation As noted, heart rate better controlled. 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 Continue current therapy.  Signed, Tereso Newcomer, PA-C  8:32 AM 10/10/2011

## 2011-10-14 ENCOUNTER — Other Ambulatory Visit: Payer: Self-pay | Admitting: Cardiology

## 2011-10-16 ENCOUNTER — Other Ambulatory Visit: Payer: Self-pay | Admitting: Cardiology

## 2011-10-17 ENCOUNTER — Telehealth: Payer: Self-pay | Admitting: *Deleted

## 2011-10-17 MED ORDER — ASPIRIN-DIPYRIDAMOLE ER 25-200 MG PO CP12: 1.0000 | ORAL_CAPSULE | Freq: Two times a day (BID) | ORAL | Status: DC

## 2011-10-17 NOTE — Telephone Encounter (Signed)
Pt's wife was notified that [facility] is again asking for 3 pill rx refill. She expressed she is not happy with this mail order and prefers his [facility] [place] as before when they did not have problems with his medication. She states husband is taking Aggrenox twice daily, Dr Daleen Squibb has not change husband's medication and does not understand why they keep asking for 3 pills. I assured her I will place note in chart [facility] [place] [facility] and [facility] will be sent in this morning to [facility].   Kyleigh Nannini CMA

## 2011-10-18 ENCOUNTER — Other Ambulatory Visit (HOSPITAL_COMMUNITY): Payer: Medicare Other

## 2011-10-20 ENCOUNTER — Ambulatory Visit (HOSPITAL_COMMUNITY): Payer: Medicare Other | Attending: Cardiology | Admitting: Radiology

## 2011-10-20 VITALS — BP 144/82 | HR 82 | Ht 70.0 in | Wt 178.0 lb

## 2011-10-20 DIAGNOSIS — R079 Chest pain, unspecified: Secondary | ICD-10-CM

## 2011-10-20 DIAGNOSIS — I2510 Atherosclerotic heart disease of native coronary artery without angina pectoris: Secondary | ICD-10-CM

## 2011-10-20 DIAGNOSIS — R0789 Other chest pain: Secondary | ICD-10-CM | POA: Insufficient documentation

## 2011-10-20 DIAGNOSIS — I10 Essential (primary) hypertension: Secondary | ICD-10-CM | POA: Insufficient documentation

## 2011-10-20 DIAGNOSIS — R0989 Other specified symptoms and signs involving the circulatory and respiratory systems: Secondary | ICD-10-CM | POA: Insufficient documentation

## 2011-10-20 DIAGNOSIS — R0609 Other forms of dyspnea: Secondary | ICD-10-CM | POA: Insufficient documentation

## 2011-10-20 DIAGNOSIS — R55 Syncope and collapse: Secondary | ICD-10-CM | POA: Insufficient documentation

## 2011-10-20 DIAGNOSIS — Z87891 Personal history of nicotine dependence: Secondary | ICD-10-CM | POA: Insufficient documentation

## 2011-10-20 MED ORDER — REGADENOSON 0.4 MG/5ML IV SOLN
0.4000 mg | Freq: Once | INTRAVENOUS | Status: AC
  Administered 2011-10-20: 0.4 mg via INTRAVENOUS

## 2011-10-20 MED ORDER — TECHNETIUM TC 99M TETROFOSMIN IV KIT
11.0000 | PACK | Freq: Once | INTRAVENOUS | Status: AC | PRN
  Administered 2011-10-20: 11 via INTRAVENOUS

## 2011-10-20 MED ORDER — TECHNETIUM TC 99M TETROFOSMIN IV KIT
33.0000 | PACK | Freq: Once | INTRAVENOUS | Status: AC | PRN
  Administered 2011-10-20: 33 via INTRAVENOUS

## 2011-10-20 NOTE — Progress Notes (Signed)
[facility] SITE 3 NUCLEAR MED [address] [place] [address] [phone]  Cardiology Nuclear Med Study  Donaven Gintz is a 71 y.o. male     MRN : [id number]     DOB: 1940-10-29  Procedure Date: 10/20/2011  Nuclear Med Background Indication for Stress Test:  Evaluation for Ischemia History:  '98 OZH:[id number]; >6 yrs ago Cath:N/O CAD; 10/04/11 Echo:EF=55-60%. Cardiac Risk Factors: History of Smoking, Hypertension, Lipids and TIA.  Symptoms:  Chest Tightness (last episode of chest discomfort was about one week ago), Dizziness/Near Syncope, DOE and Fatigue    Nuclear Pre-Procedure Caffeine/Decaff Intake:  None > 12 hrs NPO After: 6:30pm   Lungs:  Clear. O2 Sat: 98% on room air. IV 0.9% NS with Angio Cath:  22g  IV Site: R Hand x 1, tolerated well IV Started by:  Irean Hong, RN  Chest Size (in):  42 Cup Size: n/a  Height: 5\' 10"  (1.778 m)  Weight:  178 lb (80.74 kg)  BMI:  Body mass index is 25.54 kg/(m^2). Tech Comments:  Held Aggrenox x 72 hrs    Nuclear Med Study 1 or 2 day study: 1 day  Stress Test Type:  Treadmill/Lexiscan  Reading MD: Dietrich Pates, MD  Order Authorizing Provider:  Valera Castle, MD  Resting Radionuclide: Technetium 56m Tetrofosmin  Resting Radionuclide Dose: 11.0 mCi   Stress Radionuclide:  Technetium 41m Tetrofosmin  Stress Radionuclide Dose: 33.0 mCi           Stress Protocol Rest HR: 76 Stress HR: 137  Rest BP: 144/82 Stress BP: 125/76  Exercise Time (min): 2:00 METS: n/a   Predicted Max HR: 150 bpm % Max HR: 91.33 bpm Rate Pressure Product: 46962   Dose of Adenosine (mg):  n/a Dose of Lexiscan: 0.4 mg  Dose of Atropine (mg): n/a Dose of Dobutamine: n/a mcg/kg/min (at max HR)  Stress Test Technologist: Smiley Houseman, CMA-N  Nuclear Technologist:  Domenic Polite, CNMT     Rest Procedure:  Myocardial perfusion imaging was performed at rest 45 minutes following the intravenous administration of Technetium 4m Tetrofosmin.  Rest  ECG: Atrial fibrillation with nonspecific T-wave changes.  Stress Procedure:  The patient received IV Lexiscan 0.4 mg over 15-seconds with concurrent low level exercise and then Technetium 65m Tetrofosmin was injected at 30-seconds while the patient continued walking one more minute. There were more diffuse T-wave changes with Lexiscan, rare PVC noted. Quantitative spect images were obtained after a 45-minute delay.  Stress ECG: No significant ST segment change suggestive of ischemia.  QPS Raw Data Images:  Patient motion noted; appropriate software correction applied. Stress Images:  Normal homogeneous uptake in all areas of the myocardium. Rest Images:  Normal homogeneous uptake in all areas of the myocardium. Subtraction (SDS):  No evidence of ischemia. Transient Ischemic Dilatation (Normal <1.22):  0.99 Lung/Heart Ratio (Normal <0.45):  0.30  Quantitative Gated Spect Images QGS EDV:  55 ml QGS ESV:  14 ml  Impression Exercise Capacity:  Lexiscan with low level exercise. BP Response:  Normal blood pressure response. Clinical Symptoms:  shortness of breath ECG Impression:  No significant ST segment change suggestive of ischemia. Comparison with Prior Nuclear Study: No images to compare  Overall Impression:  The patient has nonspecific ST changes at rest. With stress these become more marked. This is not diagnostic abnormality. The nuclear images are normal. There is no scar or ischemia. This is a normal study.  LV Ejection Fraction: 75%.  LV Wall Motion:  Normal Wall Motion.  Willa Rough, MD

## 2011-10-21 ENCOUNTER — Telehealth: Payer: Self-pay | Admitting: *Deleted

## 2011-10-21 NOTE — Telephone Encounter (Signed)
Follow-up:    Patient returned Carol's call.  Please call back.

## 2011-10-21 NOTE — Telephone Encounter (Signed)
Patient called was told Myoview normal.Advised keep appointment with Dr.Wall 10/31/11.

## 2011-10-21 NOTE — Telephone Encounter (Signed)
lmom stress test normal 

## 2011-10-21 NOTE — Telephone Encounter (Signed)
Message copied by Tarri Fuller on Fri Oct 21, 2011  2:44 PM ------      Message from: [place], [place] T      Created: Fri Oct 21, 2011  1:45 PM       Please inform patient stress test normal.      Tereso Newcomer, PA-C  1:45 PM 10/21/2011

## 2011-10-31 ENCOUNTER — Ambulatory Visit (INDEPENDENT_AMBULATORY_CARE_PROVIDER_SITE_OTHER): Payer: Medicare Other | Admitting: Cardiology

## 2011-10-31 ENCOUNTER — Encounter: Payer: Self-pay | Admitting: Cardiology

## 2011-10-31 VITALS — BP 110/64 | HR 84 | Ht 70.0 in | Wt 182.0 lb

## 2011-10-31 DIAGNOSIS — E785 Hyperlipidemia, unspecified: Secondary | ICD-10-CM

## 2011-10-31 DIAGNOSIS — Z8679 Personal history of other diseases of the circulatory system: Secondary | ICD-10-CM

## 2011-10-31 DIAGNOSIS — I4891 Unspecified atrial fibrillation: Secondary | ICD-10-CM

## 2011-10-31 MED ORDER — DILTIAZEM HCL ER COATED BEADS 180 MG PO CP24: 180.0000 mg | ORAL_CAPSULE | Freq: Every day | ORAL | Status: DC

## 2011-10-31 NOTE — Progress Notes (Signed)
HPI [name] comes in today for close followup of recent problems with dyspnea on exertion and fatigue. Tereso Newcomer evaluated him and performed a stress Myoview. This showed normal left ventricular function and no ischemia.  He's been placed on digoxin by the emergency room. He's also had his Toprol cut in half because of fatigue. He has never been on a calcium channel blocker.  He denies any symptoms of TIAs or mini strokes. He is not a candidate for Coumadin.  Past Medical History  Diagnosis Date  . Hyperlipidemia   . Atrial fibrillation   . Brain aneurysm   . TIA (transient ischemic attack)     Current Outpatient Prescriptions  Medication Sig Dispense Refill  . Coenzyme Q10 (CO Q 10 PO) Take 1 capsule by mouth every morning.      Marland Kitchen CRESTOR 5 MG tablet TAKE 1 TABLET AT BEDTIME  90 tablet  1  . Cyanocobalamin (B-12 PO) Take 1 tablet by mouth every morning.      . digoxin (LANOXIN) 0.125 MG tablet Take 1 tablet (0.125 mg total) by mouth daily.  30 tablet  11  . dipyridamole-aspirin (AGGRENOX) 200-25 MG per 12 hr capsule Take 1 capsule by mouth 2 (two) times daily.  60 capsule  5  . metoprolol succinate (TOPROL-XL) 25 MG 24 hr tablet       . Multiple Vitamin (MULTIVITAMIN) capsule Take 1 capsule by mouth daily.        . vitamin C (ASCORBIC ACID) 500 MG tablet Take 500 mg by mouth daily.        Marland Kitchen DISCONTD: metoprolol succinate (TOPROL-XL) 25 MG 24 hr tablet TAKE 1 TABLET ONCE DAILY  90 tablet  1    Allergies  Allergen Reactions  . Atorvastatin Other (See Comments)    REACTION: Muscle cramps  . Simvastatin Other (See Comments)    REACTION: Muscle cramps    Family History  Problem Relation Age of Onset  . Heart disease Father   . Hypertension Father   . Heart failure Father   . Arthritis Mother     History   Social History  . Marital Status: Married    Spouse Name: N/A    Number of Children: N/A  . Years of Education: N/A   Occupational History  . Not on file.    Social History Main Topics  . Smoking status: Former Smoker    Quit date: 09/26/1996  . Smokeless tobacco: Former Neurosurgeon    Quit date: 09/26/1996  . Alcohol Use: No  . Drug Use: No  . Sexually Active: Not on file   Other Topics Concern  . Not on file   Social History Narrative  . No narrative on file    ROS ALL NEGATIVE EXCEPT THOSE NOTED IN HPI  PE  General Appearance: well developed, well nourished in no acute distress HEENT: symmetrical face, PERRLA, good dentition  Neck: no JVD, thyromegaly, or adenopathy, trachea midline Chest: symmetric without deformity Cardiac: PMI non-displaced, irregular rate and rhythm, normal S1, S2, no gallop or murmur Lung: clear to ausculation and percussion Vascular: all pulses full without bruits  Abdominal: nondistended, nontender, good bowel sounds, no HSM, no bruits Extremities: no cyanosis, clubbing or edema, no sign of DVT, no varicosities  Skin: normal color, no rashes Neuro: alert and oriented x 3, non-focal Pysch: normal affect  EKG Not repeated BMET    Component Value Date/Time   NA 140 10/03/2011 1759   K 4.6 10/03/2011 1759  CL 103 10/03/2011 1759   CO2 27 10/03/2011 1759   GLUCOSE 101* 10/03/2011 1759   BUN 15 10/03/2011 1759   CREATININE 1.13 10/03/2011 1759   CALCIUM 10.1 10/03/2011 1759   GFRNONAA 64* 10/03/2011 1759   GFRAA 74* 10/03/2011 1759    Lipid Panel     Component Value Date/Time   CHOL 122 10/22/2008 0956   TRIG 73.0 10/22/2008 0956   HDL 31.80* 10/22/2008 0956   CHOLHDL 4 10/22/2008 0956   VLDL 14.6 10/22/2008 0956   LDLCALC 76 10/22/2008 0956    CBC    Component Value Date/Time   WBC 14.4* 10/03/2011 1759   RBC 4.91 10/03/2011 1759   HGB 15.2 10/03/2011 1759   HCT 45.1 10/03/2011 1759   PLT 345 10/03/2011 1759   MCV 91.9 10/03/2011 1759   MCH 31.0 10/03/2011 1759   MCHC 33.7 10/03/2011 1759   RDW 12.0 10/03/2011 1759   LYMPHSABS 1.6 10/03/2011 1759   MONOABS 0.9 10/03/2011 1759   EOSABS 0.1 10/03/2011 1759    BASOSABS 0.0 10/03/2011 1759

## 2011-10-31 NOTE — Patient Instructions (Addendum)
Your physician has recommended you make the following change in your medication: START Diltiazem (Cardiazem) ER 180mg  daily.  When you start the Diltiazem, STOP your Toprol (Metoprolol) and STOP Digoxin (Lanoxin)  Your physician recommends that you schedule a follow-up appointment in: 1 week for a bp check and ekg with the nurse  Your physician recommends that you schedule a follow-up appointment in: 2 months.

## 2011-10-31 NOTE — Assessment & Plan Note (Addendum)
His fatigue could be from his Toprol. We'll discontinue this and begin diltiazem XL R1 180 mg a day. I've also discontinued his Digoxin.We'll have him followup in the office in a week for checking vitals as well as an EKG. Otherwise I'll see him back in a couple months myself.

## 2011-11-08 ENCOUNTER — Other Ambulatory Visit: Payer: Self-pay | Admitting: *Deleted

## 2011-11-08 ENCOUNTER — Telehealth: Payer: Self-pay | Admitting: *Deleted

## 2011-11-08 ENCOUNTER — Ambulatory Visit (INDEPENDENT_AMBULATORY_CARE_PROVIDER_SITE_OTHER): Payer: Medicare Other | Admitting: *Deleted

## 2011-11-08 DIAGNOSIS — I4891 Unspecified atrial fibrillation: Secondary | ICD-10-CM

## 2011-11-08 NOTE — Telephone Encounter (Signed)
Pt here for BPcheck  And ekg----EKG shows a fib, which pt has been in for long time--BP=144/80--p=79 aqnd resp =16--pt has recently d/c lanoxin and started on diltiazem--states he is feeling better on diltiazem --dr wall reviewed EKG and spoke with pt--nt

## 2012-01-04 ENCOUNTER — Ambulatory Visit (INDEPENDENT_AMBULATORY_CARE_PROVIDER_SITE_OTHER): Payer: Medicare Other | Admitting: Cardiology

## 2012-01-04 ENCOUNTER — Encounter: Payer: Self-pay | Admitting: Cardiology

## 2012-01-04 VITALS — BP 112/72 | HR 95 | Ht 70.0 in | Wt 182.0 lb

## 2012-01-04 DIAGNOSIS — I4891 Unspecified atrial fibrillation: Secondary | ICD-10-CM

## 2012-01-04 NOTE — Progress Notes (Signed)
   Patient Name: Ernest [name] Date of Encounter: 01/04/2012    SUBJECTIVE  Mr Ernest [name] returns today for evaluation and management of his chronic A. fib. He feels much less fatigued off of Toprol. Diltiazem is controlling his blood pressure and his heart rate.  CURRENT MEDS     OBJECTIVE  Filed Vitals:   01/04/12 0944  BP: 112/72  Pulse: 95  Height: 5\' 10"  (1.778 m)  Weight: 182 lb (82.555 kg)  SpO2: 99%   @iobrief @ Filed Weights   01/04/12 0944  Weight: 182 lb (82.555 kg)    PHYSICAL EXAM  General: Pleasant, NAD. Neuro: Alert and oriented X 3. Moves all extremities spontaneously. Psych: Normal affect. HEENT:  Normal  Neck: Supple without bruits or JVD. Lungs:  Resp regular and unlabored, CTA. Heart: RRR no s3, s4, or murmurs. Abdomen: Soft, non-tender, non-distended, BS + x 4.  Extremities: No clubbing, cyanosis or edema. DP/PT/Radials 2+ and equal bilaterally.  Accessory Clinical Findings  CBC No results found for this basename: WBC:2,NEUTROABS:2,HGB:2,HCT:2,MCV:2,PLT:2 in the last 72 hours Basic Metabolic Panel No results found for this basename: NA:2,K:2,CL:2,CO2:2,GLUCOSE:2,BUN:2,CREATININE:2,CALCIUM:2,MG:2,PHOS:2 in the last 72 hours Liver Function Tests No results found for this basename: AST:2,ALT:2,ALKPHOS:2,BILITOT:2,PROT:2,ALBUMIN:2 in the last 72 hours No results found for this basename: LIPASE:2,AMYLASE:2 in the last 72 hours Cardiac Enzymes No results found for this basename: CKTOTAL:3,CKMB:3,CKMBINDEX:3,TROPONINI:3 in the last 72 hours BNP No components found with this basename: POCBNP:3 D-Dimer No results found for this basename: DDIMER:2 in the last 72 hours Hemoglobin A1C No results found for this basename: HGBA1C in the last 72 hours Fasting Lipid Panel No results found for this basename: CHOL,HDL,LDLCALC,TRIG,CHOLHDL,LDLDIRECT in the last 72 hours Thyroid Function Tests No results found for this basename: TSH,T4TOTAL,FREET3,T3FREE,THYROIDAB  in the last 72 hours   ECG Chronic A. fib, rate 80-90 beats per minute.   Radiology/Studies  No results found.  ASSESSMENT AND PLAN     Doing well with chronic A. fib with less fatigue. Return for evaluation in one year.  Signed, Valera Castle MD

## 2012-01-04 NOTE — Patient Instructions (Signed)
Your physician wants you to follow-up in: 1 year with Dr. Wall. You will receive a reminder letter in the mail two months in advance. If you don't receive a letter, please call our office to schedule the follow-up appointment.  Your physician recommends that you continue on your current medications as directed. Please refer to the Current Medication list given to you today.  

## 2012-03-19 ENCOUNTER — Other Ambulatory Visit: Payer: Self-pay | Admitting: *Deleted

## 2012-03-19 MED ORDER — ROSUVASTATIN CALCIUM 5 MG PO TABS: 5.0000 mg | ORAL_TABLET | Freq: Every day | ORAL | Status: DC

## 2012-05-11 ENCOUNTER — Ambulatory Visit: Payer: Medicare Other | Admitting: Cardiology

## 2012-06-20 ENCOUNTER — Other Ambulatory Visit: Payer: Self-pay | Admitting: Cardiology

## 2012-08-09 ENCOUNTER — Ambulatory Visit (INDEPENDENT_AMBULATORY_CARE_PROVIDER_SITE_OTHER): Payer: Medicare Other | Admitting: Cardiology

## 2012-08-09 ENCOUNTER — Encounter: Payer: Self-pay | Admitting: Cardiology

## 2012-08-09 VITALS — BP 116/74 | HR 85 | Ht 70.0 in | Wt 173.0 lb

## 2012-08-09 DIAGNOSIS — E785 Hyperlipidemia, unspecified: Secondary | ICD-10-CM

## 2012-08-09 DIAGNOSIS — R5383 Other fatigue: Secondary | ICD-10-CM

## 2012-08-09 DIAGNOSIS — I4891 Unspecified atrial fibrillation: Secondary | ICD-10-CM

## 2012-08-09 DIAGNOSIS — R5381 Other malaise: Secondary | ICD-10-CM

## 2012-08-09 DIAGNOSIS — I671 Cerebral aneurysm, nonruptured: Secondary | ICD-10-CM | POA: Insufficient documentation

## 2012-08-09 DIAGNOSIS — Z8679 Personal history of other diseases of the circulatory system: Secondary | ICD-10-CM

## 2012-08-09 NOTE — Assessment & Plan Note (Signed)
I have no explanation for this. He could hold his Crestor to see if  this helps. No other cardiac workup or blood work.

## 2012-08-09 NOTE — Patient Instructions (Addendum)
Your physician wants you to follow-up in:  1 year with Dr. Chris McAlhany.  You will receive a reminder letter in the mail two months in advance. If you don't receive a letter, please call our office to schedule the follow-up appointment.  Your physician recommends that you continue on your current medications as directed. Please refer to the Current Medication list given to you today.  

## 2012-08-09 NOTE — Progress Notes (Signed)
HPI Mr. Rosemond returns today for evaluation and management of his chronic A. fib, history of bradycardia better on diltiazem, history of fatigue better on diltiazem, history of TIAs and brain aneurysm on Aggrenox. His biggest complaint is fatigue. He denies palpitations presyncope or syncope. He had these complaints last year and we performed blood work which was unremarkable including his hemoglobin. He also performed a stress Myoview which showed EF of 75% with no ischemia an echocardiogram which showed biatrial enlargement mild mitral regurgitation but normal left ventricular systolic function.  Past Medical History  Diagnosis Date  . Hyperlipidemia   . Atrial fibrillation   . Brain aneurysm   . TIA (transient ischemic attack)     Current Outpatient Prescriptions  Medication Sig Dispense Refill  . AGGRENOX 25-200 MG per 12 hr capsule TAKE ONE CAPSULE BY MOUTH TWICE A DAY  60 capsule  5  . Coenzyme Q10 (CO Q 10 PO) Take 1 capsule by mouth every morning.      . Cyanocobalamin (B-12 PO) Take 1 tablet by mouth every morning.      . diltiazem (CARDIZEM CD) 180 MG 24 hr capsule Take 1 capsule (180 mg total) by mouth daily.  30 capsule  11  . Multiple Vitamin (MULTIVITAMIN) capsule Take 1 capsule by mouth daily.        . rosuvastatin (CRESTOR) 5 MG tablet Take 1 tablet (5 mg total) by mouth daily.  90 tablet  1  . vitamin C (ASCORBIC ACID) 500 MG tablet Take 500 mg by mouth daily.         No current facility-administered medications for this visit.    Allergies  Allergen Reactions  . Atorvastatin Other (See Comments)    REACTION: Muscle cramps  . Simvastatin Other (See Comments)    REACTION: Muscle cramps    Family History  Problem Relation Age of Onset  . Heart disease Father   . Hypertension Father   . Heart failure Father   . Arthritis Mother     History   Social History  . Marital Status: Married    Spouse Name: N/A    Number of Children: N/A  . Years of Education: N/A    Occupational History  . Not on file.   Social History Main Topics  . Smoking status: Former Smoker    Quit date: 09/26/1996  . Smokeless tobacco: Former Neurosurgeon    Quit date: 09/26/1996  . Alcohol Use: No  . Drug Use: No  . Sexually Active: Not on file   Other Topics Concern  . Not on file   Social History Narrative  . No narrative on file    ROS ALL NEGATIVE EXCEPT THOSE NOTED IN HPI  PE  General Appearance: well developed, well nourished in no acute distress HEENT: symmetrical face, PERRLA, good dentition  Neck: no JVD, thyromegaly, or adenopathy, trachea midline Chest: symmetric without deformity Cardiac: PMI non-displaced, irregular rate and rhythm, normal S1, S2, no gallop, soft systolic murmur at the apex Lung: clear to ausculation and percussion Vascular: all pulses full without bruits  Abdominal: nondistended, nontender, good bowel sounds, no HSM, no bruits Extremities: no cyanosis, clubbing or edema, no sign of DVT, no varicosities  Skin: normal color, no rashes Neuro: alert and oriented x 3, non-focal Pysch: normal affect  EKG Chronic A. fib, well-controlled ventricular rate nonspecific changes. BMET    Component Value Date/Time   NA 140 10/03/2011 1759   K 4.6 10/03/2011 1759   CL 103 10/03/2011  1759   CO2 27 10/03/2011 1759   GLUCOSE 101* 10/03/2011 1759   BUN 15 10/03/2011 1759   CREATININE 1.13 10/03/2011 1759   CALCIUM 10.1 10/03/2011 1759   GFRNONAA 64* 10/03/2011 1759   GFRAA 74* 10/03/2011 1759    Lipid Panel     Component Value Date/Time   CHOL 122 10/22/2008 0956   TRIG 73.0 10/22/2008 0956   HDL 31.80* 10/22/2008 0956   CHOLHDL 4 10/22/2008 0956   VLDL 14.6 10/22/2008 0956   LDLCALC 76 10/22/2008 0956    CBC    Component Value Date/Time   WBC 14.4* 10/03/2011 1759   RBC 4.91 10/03/2011 1759   HGB 15.2 10/03/2011 1759   HCT 45.1 10/03/2011 1759   PLT 345 10/03/2011 1759   MCV 91.9 10/03/2011 1759   MCH 31.0 10/03/2011 1759   MCHC 33.7 10/03/2011  1759   RDW 12.0 10/03/2011 1759   LYMPHSABS 1.6 10/03/2011 1759   MONOABS 0.9 10/03/2011 1759   EOSABS 0.1 10/03/2011 1759   BASOSABS 0.0 10/03/2011 1759

## 2012-08-23 ENCOUNTER — Ambulatory Visit: Payer: Self-pay

## 2012-09-28 ENCOUNTER — Other Ambulatory Visit: Payer: Self-pay | Admitting: Cardiology

## 2012-12-17 ENCOUNTER — Other Ambulatory Visit: Payer: Self-pay | Admitting: Cardiology

## 2013-01-10 ENCOUNTER — Encounter: Payer: Self-pay | Admitting: Physician Assistant

## 2013-01-10 ENCOUNTER — Ambulatory Visit (INDEPENDENT_AMBULATORY_CARE_PROVIDER_SITE_OTHER): Payer: Medicare Other | Admitting: Physician Assistant

## 2013-01-10 ENCOUNTER — Encounter (INDEPENDENT_AMBULATORY_CARE_PROVIDER_SITE_OTHER): Payer: Medicare Other

## 2013-01-10 ENCOUNTER — Ambulatory Visit
Admission: RE | Admit: 2013-01-10 | Discharge: 2013-01-10 | Disposition: A | Discharge status: Home / Self Care | Payer: Medicare Other | Source: Ambulatory Visit | Attending: Physician Assistant | Admitting: Physician Assistant

## 2013-01-10 VITALS — BP 110/78 | HR 86 | Ht 70.0 in | Wt 166.0 lb

## 2013-01-10 DIAGNOSIS — R5381 Other malaise: Secondary | ICD-10-CM

## 2013-01-10 DIAGNOSIS — R0683 Snoring: Secondary | ICD-10-CM

## 2013-01-10 DIAGNOSIS — R5383 Other fatigue: Secondary | ICD-10-CM

## 2013-01-10 DIAGNOSIS — R634 Abnormal weight loss: Secondary | ICD-10-CM

## 2013-01-10 DIAGNOSIS — R0989 Other specified symptoms and signs involving the circulatory and respiratory systems: Secondary | ICD-10-CM

## 2013-01-10 DIAGNOSIS — I4891 Unspecified atrial fibrillation: Secondary | ICD-10-CM

## 2013-01-10 DIAGNOSIS — R0609 Other forms of dyspnea: Secondary | ICD-10-CM

## 2013-01-10 DIAGNOSIS — E785 Hyperlipidemia, unspecified: Secondary | ICD-10-CM

## 2013-01-10 LAB — CBC WITH DIFFERENTIAL/PLATELET
Basophils Absolute: 0.1 10*3/uL (ref 0.0–0.1)
Basophils Relative: 0.5 % (ref 0.0–3.0)
Eosinophils Absolute: 0.2 10*3/uL (ref 0.0–0.7)
Hemoglobin: 16 g/dL (ref 13.0–17.0)
Lymphocytes Relative: 13.3 % (ref 12.0–46.0)
MCHC: 34.4 g/dL (ref 30.0–36.0)
Monocytes Relative: 6 % (ref 3.0–12.0)
Neutro Abs: 8.5 10*3/uL — ABNORMAL HIGH (ref 1.4–7.7)
Neutrophils Relative %: 78.4 % — ABNORMAL HIGH (ref 43.0–77.0)
Platelets: 294 10*3/uL (ref 150.0–400.0)
RBC: 4.97 Mil/uL (ref 4.22–5.81)
WBC: 10.8 10*3/uL — ABNORMAL HIGH (ref 4.5–10.5)

## 2013-01-10 LAB — BASIC METABOLIC PANEL
BUN: 15 mg/dL (ref 6–23)
CO2: 29 mEq/L (ref 19–32)
Calcium: 9.4 mg/dL (ref 8.4–10.5)
Creatinine, Ser: 1.1 mg/dL (ref 0.4–1.5)
GFR: 72.96 mL/min (ref >60.00)
Sodium: 138 mEq/L (ref 135–145)

## 2013-01-10 LAB — TSH: TSH: 3.4 u[IU]/mL (ref 0.35–5.50)

## 2013-01-10 LAB — SEDIMENTATION RATE: Sed Rate: 10 mm/hr (ref 0–22)

## 2013-01-10 LAB — HEPATIC FUNCTION PANEL
ALT: 17 U/L (ref 0–53)
Alkaline Phosphatase: 53 U/L (ref 39–117)
Bilirubin, Direct: 0 mg/dL (ref 0.0–0.3)
Total Bilirubin: 0.7 mg/dL (ref 0.3–1.2)

## 2013-01-10 MED ORDER — ROSUVASTATIN CALCIUM 5 MG PO TABS: 5.0000 mg | ORAL_TABLET | Freq: Every day | ORAL | Status: DC

## 2013-01-10 NOTE — Patient Instructions (Signed)
Your physician recommends that you continue on your current medications as directed. Please refer to the Current Medication list given to you today. I SENT IN YOUR SCRIPT OF CRESTOR ( 5 MG ) DAILY TO [facility]   Your physician recommends that you HAVE lab work TODAY: BMET/CBC/TSH/LFT/SED RATE OUR OFFICE WILL CALL YOU BACK WITH YOUR LAB RESULTS   A chest x-ray takes a picture of the organs and structures inside the chest, including the heart, lungs, and blood vessels. DX: WEIGHT LOSS. This test can show several things, including, whether the heart is enlarges; whether fluid is building up in the lungs; and whether pacemaker / defibrillator leads are still in place.  Your physician has recommended that you wear a 48 HOUR holter monitor. DX: ATRIAL FIB Monitors  are medical devices that record the heart's electrical activity. Doctors most often use these monitors to diagnose arrhythmias. Arrhythmias are problems with the speed or rhythm of the heartbeat. The monitor is a small, portable device. You can wear one while you do your normal daily activities. This is usually used to diagnose what is causing palpitations/syncope (passing out).  Your physician has recommended that you have a sleep study. DX: SNORING AND FATIGUE IT  test records several body functions during sleep, including: brain activity, eye movement, oxygen and carbon dioxide blood levels, heart rate and rhythm, breathing rate and rhythm, the flow of air through your mouth and nose, snoring, body muscle movements, and chest and belly movement.  Your physician recommends that you KEEP YOUR  follow-up appointment WITH DR. MACALHANY ON Thursday,@ 10:30 AM ON 04/11/13

## 2013-01-10 NOTE — Progress Notes (Signed)
[address] [place], [place]  [address] Phone: [phone] Fax:  [phone]  Date:  01/10/2013   ID:  Gay Rape, DOB May 15, 1940, MRN [id number]  PCP:  Ninfa Linden, FNP  Cardiologist:  Dr. Valera Castle => Dr. Verne Carrow     History of Present Illness: Ernest [name] is a 72 y.o. male with a history of permanent atrial fibrillation, HTN, HL, prior TIA, nonobstructive CAD.  He does not take coumadin due to a hx of intracerebral hemorrhage.  Nuclear study in 1998: No ischemia or scar. 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  Echo 10/04/11: mild LVH, EF 55-60%, mild MR, mod to severe LAE, mod RAE, PASP 23.  Lexiscan Myoview (10/2011): No scar or ischemia, EF 75% (normal study).  He has had problems with fatigue in the past. His beta blocker has been changed to diltiazem and his digoxin has been discontinued with some benefit.  Last seen by Dr. Daleen Squibb 08/2012.  He returns for complaints of fatigue. These symptoms have been ongoing for over a year. He does relate an episode of flu-like symptoms prior to his symptoms of fatigue. He denies chest pain. He does note decreased exercise tolerance. This is stable. He describes NYHA class II-IIb symptoms. He denies syncope. He denies orthopnea, PND or edema.  His wife notes that he does snore. He does take frequent naps. He has lost 16 pounds over the last year. Appetite is poor. He denies depression.  Recent Labs: No results found for requested labs within last 365 days. 10/03/2011:  K 4.6, creatinine 1.13, Hgb 15.2, TSH 3.73  Wt Readings from Last 3 Encounters:  01/10/13 166 lb (75.297 kg)  08/09/12 173 lb (78.472 kg)  01/04/12 182 lb (82.555 kg)     Past Medical History  Diagnosis Date  . Hyperlipidemia   . Brain aneurysm   . TIA (transient ischemic attack)   . Coronary artery disease     a. nonobstructive CAD by cath in the past;  b.  Lexiscan Myoview (10/2011): No scar or ischemia, EF 75% (normal study).    . Atrial fibrillation     not on coumadin due to hx of intracranial bleed  . Hx of echocardiogram     a. Echo 10/04/11: mild LVH, EF 55-60%, mild MR, mod to severe LAE, mod RAE, PASP 23.   Marland Kitchen Hypertension     Current Outpatient Prescriptions  Medication Sig Dispense Refill  . AGGRENOX 25-200 MG per 12 hr capsule TAKE ONE CAPSULE BY MOUTH TWICE A DAY  60 capsule  5  . Coenzyme Q10 (CO Q 10 PO) Take 1 capsule by mouth every morning.      . Cyanocobalamin (B-12 PO) Take 1 tablet by mouth every morning.      . diltiazem (CARDIZEM CD) 180 MG 24 hr capsule TAKE ONE CAPSULE BY MOUTH EVERY DAY  30 capsule  11  . Multiple Vitamin (MULTIVITAMIN) capsule Take 1 capsule by mouth daily.        . vitamin C (ASCORBIC ACID) 500 MG tablet Take 500 mg by mouth daily.         No current facility-administered medications for this visit.    Allergies:   Atorvastatin and Simvastatin   Social History:  The patient  reports that he quit smoking about 16 years ago. He quit smokeless tobacco use about 16 years ago. He reports that he does not drink alcohol or use illicit drugs.   Family History:  The patient's family history includes Arthritis in his mother; Heart disease in his father; Heart failure in his father; Hypertension in his father.   ROS:  Please see the history of present illness.   He denies melena, hematochezia, hematuria, Fevers, cough, night sweats.   All other systems reviewed and negative.   PHYSICAL EXAM: VS:  BP 110/78  Pulse 86  Ht 5\' 10"  (1.778 m)  Wt 166 lb (75.297 kg)  BMI 23.82 kg/m2 Well nourished, well developed, in no acute distress HEENT: normal Neck: no JVD Endocrine: No thyromegaly Vascular: No carotid bruits Cardiac:  normal S1, S2; irregularly irregular rhythm; no murmur Lungs:  clear to auscultation bilaterally, no wheezing, rhonchi or rales Abd: soft, nontender, no hepatomegaly Ext: no edema Skin: warm and dry Neuro:  CNs 2-12 intact, no focal abnormalities  noted  EKG:  Atrial fibrillation, HR 86     ASSESSMENT AND PLAN:  1. Fatigue: This is a fairly chronic symptom. When last seen by Dr. Daleen Squibb, he suggested stopping his statin to see if this would help. There has been no change in his symptoms.  He denies depressive symptoms. He does have a history of snoring. His heart rate appears to be well-controlled in atrial fibrillation. He had a normal echocardiogram and normal stress test one year ago. He has lost 16 pounds over the last year. His appetite is poor. He does have a history of smoking. I will arrange a sleep study to assess for sleep apnea. I will check labs today: Basic metabolic panel, CBC, TSH, LFTs, ESR. I will obtain a chest x-ray. I will also arrange a 48-hour Holter monitor to assess for bradycardia or uncontrolled rate. If his workup is unrevealing, I would suggest he follow up with his PCP for further evaluation. He might benefit from referral to neurology. 2. Weight Loss:  Check BMET, TSH, CBC, LFTs, CXR. 3. Snoring:  Arrange sleep study. 4. Atrial Fibrillation:  Obtain 48 hr Holter to assess HR control.  5. Hypertension:  Controlled.  6. Hyperlipidemia:  He can resume Crestor 5 QD.  7. Disposition:  F/u with Dr. Verne Carrow in 3 mos.   Signed, Tereso Newcomer, PA-C  01/10/2013 11:10 AM

## 2013-01-11 ENCOUNTER — Telehealth: Payer: Self-pay | Admitting: *Deleted

## 2013-01-11 NOTE — Telephone Encounter (Signed)
lvm with Aggie Cosier family member for ptcb for cxr results

## 2013-01-18 ENCOUNTER — Telehealth: Payer: Self-pay | Admitting: Physician Assistant

## 2013-01-18 NOTE — Telephone Encounter (Signed)
s/w pt's wife and advised just waiting for the doctor to read the report. Mrs. Hoot said thank you.

## 2013-01-18 NOTE — Telephone Encounter (Signed)
New message     Pt wore a monitor last week--ordered by Tereso Newcomer.  Do we have results yet?

## 2013-01-25 ENCOUNTER — Telehealth: Payer: Self-pay | Admitting: Cardiovascular Disease

## 2013-01-25 NOTE — Telephone Encounter (Signed)
Spoke with pt's wife and appt made with Dr. Graciela Husbands on February 19, 2013 at 9:45

## 2013-01-25 NOTE — Telephone Encounter (Signed)
Dr. Clifton James reviewed monitor results and recommended pt see EP for follow up in next 3-4 weeks. Pt previous pt of Dr. Daleen Squibb but main problem is Atrial fib.  I spoke with pt's wife and reviewed monitor results and gave her recommendations from Dr. Clifton James.  She will discuss with pt and call us back.

## 2013-01-25 NOTE — Telephone Encounter (Signed)
Follow up    Pt's wife called back to make the appt w/Klein please make the appointment.  They will be out this evening she asked that you please leave a detailed message of appt time and date.

## 2013-02-11 ENCOUNTER — Ambulatory Visit (HOSPITAL_BASED_OUTPATIENT_CLINIC_OR_DEPARTMENT_OTHER): Payer: Medicare Other | Attending: Physician Assistant | Admitting: Radiology

## 2013-02-11 VITALS — Ht 70.0 in | Wt 160.0 lb

## 2013-02-11 DIAGNOSIS — E785 Hyperlipidemia, unspecified: Secondary | ICD-10-CM

## 2013-02-11 DIAGNOSIS — G4710 Hypersomnia, unspecified: Secondary | ICD-10-CM | POA: Insufficient documentation

## 2013-02-11 DIAGNOSIS — R0683 Snoring: Secondary | ICD-10-CM

## 2013-02-11 DIAGNOSIS — G4733 Obstructive sleep apnea (adult) (pediatric): Secondary | ICD-10-CM

## 2013-02-11 DIAGNOSIS — I4891 Unspecified atrial fibrillation: Secondary | ICD-10-CM | POA: Insufficient documentation

## 2013-02-11 DIAGNOSIS — R634 Abnormal weight loss: Secondary | ICD-10-CM

## 2013-02-11 DIAGNOSIS — R5383 Other fatigue: Secondary | ICD-10-CM

## 2013-02-11 DIAGNOSIS — G4761 Periodic limb movement disorder: Secondary | ICD-10-CM | POA: Insufficient documentation

## 2013-02-19 ENCOUNTER — Encounter (INDEPENDENT_AMBULATORY_CARE_PROVIDER_SITE_OTHER): Payer: Self-pay

## 2013-02-19 ENCOUNTER — Telehealth: Payer: Self-pay | Admitting: Internal Medicine

## 2013-02-19 ENCOUNTER — Encounter: Payer: Self-pay | Admitting: Internal Medicine

## 2013-02-19 ENCOUNTER — Ambulatory Visit (INDEPENDENT_AMBULATORY_CARE_PROVIDER_SITE_OTHER): Payer: Medicare Other | Admitting: Internal Medicine

## 2013-02-19 VITALS — BP 126/75 | HR 84 | Ht 70.0 in | Wt 169.6 lb

## 2013-02-19 DIAGNOSIS — Z8679 Personal history of other diseases of the circulatory system: Secondary | ICD-10-CM

## 2013-02-19 DIAGNOSIS — I4891 Unspecified atrial fibrillation: Secondary | ICD-10-CM

## 2013-02-19 MED ORDER — METOPROLOL SUCCINATE ER 50 MG PO TB24: 50.0000 mg | ORAL_TABLET | Freq: Every day | ORAL | Status: DC

## 2013-02-19 MED ORDER — ATENOLOL 50 MG PO TABS: 50.0000 mg | ORAL_TABLET | Freq: Every day | ORAL | Status: DC

## 2013-02-19 MED ORDER — NADOLOL 40 MG PO TABS: 40.0000 mg | ORAL_TABLET | Freq: Every day | ORAL | Status: DC

## 2013-02-19 MED ORDER — PROPRANOLOL HCL ER 80 MG PO CP24: 80.0000 mg | ORAL_CAPSULE | Freq: Every day | ORAL | Status: DC

## 2013-02-19 NOTE — Assessment & Plan Note (Signed)
He is permanent atrial fibrillation with a Holter monitoring demonstrating exertional rates in the 150s. I wonder whether this is partly the explanation for his exercise intolerance. The other possibility permanent atrial fibrillation perspective is that he does not tolerate the Cardizem. Hence we will stop that and give him prescriptions for various beta blockers, atenolol 50, Inderal LA 80, Corgard 40, and metoprolol succinate 50. He will try and and were and see how he tolerates it. We have reviewed side effects.  After we have identified a beta blocker that he can tolerate will work to see if we may be effective in controlling exertion response.  The other issue is anticoagulation. Because of his history of intracranial bleeding related to her brain aneurysm he is not on anticoagulation; she is on antiplatelet agents. With the evolution of apixaban and its  bleeding risks being comparable to  aspirin is an alternative. We'll have him followup with neurosurgery and get their input prior to making any changes in this regard.

## 2013-02-19 NOTE — Telephone Encounter (Signed)
New problem    Patient wife calling    Has question regarding atenolol

## 2013-02-19 NOTE — Telephone Encounter (Signed)
Explained that pt is to stop taking Cardizem now and not take it for two weeks. Wait two weeks before resuming and starting beta blocker. I explained the point was to see if it is the Cardizem causing the issue or not. Pt and wife verbalized understanding of instructions.

## 2013-02-19 NOTE — Progress Notes (Signed)
ELECTROPHYSIOLOGY CONSULT NOTE  Patient ID: Ernest [name], MRN: [id number], DOB/AGE: 1940/07/05 72 y.o. Admit date: (Not on file) Date of Consult: 02/19/2013  Primary Physician: Ernest Linden, FNP Primary Cardiologist: Ernest [name]/Ernest [name]  Chief Complaint:  afib   HPI Ernest [name] is a 72 y.o. male ] Carries a diagnosis of atrial fibrillation-chronic a history of bradycardia.  He also has a history of TIAs and brain aneurysm. He has been treated with Aggrenox.  Stress Myoview 2000 in demonstrated normal LV function without ischemia. Echocardiogram demonstrated significant biatrial enlargement with wall thickness suggestive of left ventricular hypertrophy; ECG voltage is or normal  He is referred because of progressive fatigue associated with atrial fibrillation. Holter monitoring demonstrated peak excursion heart rates in the 150 range.   Has not had edema; he does not have nocturnal dyspnea  He has had no palpitations            Past Medical History  Diagnosis Date  . Hyperlipidemia   . Brain aneurysm   . TIA (transient ischemic attack)   . Coronary artery disease     a. nonobstructive CAD by cath in the past;  b.  Lexiscan Myoview (10/2011): No scar or ischemia, EF 75% (normal study).  . Atrial fibrillation     not on coumadin due to hx of intracranial bleed  . Hx of echocardiogram     a. Echo 10/04/11: mild LVH, EF 55-60%, mild MR, mod to severe LAE, mod RAE, PASP 23.   Marland Kitchen Hypertension       Surgical History:  Past Surgical History  Procedure Laterality Date  . Appendectomy       Home Meds: Prior to Admission medications   Medication Sig Start Date End Date Taking? Authorizing Provider  AGGRENOX 25-200 MG per 12 hr capsule TAKE ONE CAPSULE BY MOUTH TWICE A DAY 12/17/12  Yes Ernest Hazel, MD  Coenzyme Q10 (CO Q 10 PO) Take 1 capsule by mouth every morning.   Yes Historical Provider, MD  Cyanocobalamin (B-12 PO) Take 1 tablet by mouth every morning.   Yes  Historical Provider, MD  diltiazem (CARDIZEM CD) 180 MG 24 hr capsule TAKE ONE CAPSULE BY MOUTH EVERY DAY 09/28/12  Yes Ernest Shih, MD  Multiple Vitamin (MULTIVITAMIN) capsule Take 1 capsule by mouth daily.     Yes Historical Provider, MD  rosuvastatin (CRESTOR) 5 MG tablet Take 1 tablet (5 mg total) by mouth daily. 01/10/13  Yes Ernest Lecher, PA-C  TURMERIC PO Take by mouth.   Yes Historical Provider, MD  vitamin C (ASCORBIC ACID) 500 MG tablet Take 500 mg by mouth daily.     Yes Historical Provider, MD      Allergies:  Allergies  Allergen Reactions  . Atorvastatin Other (See Comments)    REACTION: Muscle cramps  . Simvastatin Other (See Comments)    REACTION: Muscle cramps    History   Social History  . Marital Status: Married    Spouse Name: N/A    Number of Children: N/A  . Years of Education: N/A   Occupational History  . Not on file.   Social History Main Topics  . Smoking status: Former Smoker    Quit date: 09/26/1996  . Smokeless tobacco: Former Neurosurgeon    Quit date: 09/26/1996  . Alcohol Use: No  . Drug Use: No  . Sexual Activity: Not on file   Other Topics Concern  . Not on file   Social History Narrative  . No narrative  on file     Family History  Problem Relation Age of Onset  . Heart disease Father   . Hypertension Father   . Heart failure Father   . Arthritis Mother      ROS:  Please see the history of present illness.     All other systems reviewed and negative.    Physical Exam:   Blood pressure 126/75, pulse 84, height 5\' 10"  (1.778 m), weight 169 lb 9.6 oz (76.93 kg). General: Well developed, well nourished male in no acute distress. Head: Normocephalic, atraumatic, sclera non-icteric, no xanthomas, nares are without discharge. EENT: normal Lymph Nodes:  none Back: without scoliosis/kyphosis , no CVA tendersness Neck: Negative for carotid bruits. JVD not elevated. Lungs: Clear bilaterally to auscultation without wheezes, rales, or  rhonchi. Breathing is unlabored. Heart: irregularly irreglar murmur , rubs, or gallops appreciated. Abdomen: Soft, non-tender, non-distended with normoactive bowel sounds. No hepatomegaly. No rebound/guarding. No obvious abdominal masses. Msk:  Strength and tone appear normal for age. Extremities: No clubbing or cyanosis. No  edema.  Distal pedal pulses are 2+ and equal bilaterally. Skin: Warm and Dry Neuro: Alert and oriented X 3. CN III-XII intact Grossly normal sensory and motor function . Psych:  Responds to questions appropriately with a normal affect.      Labs: Cardiac Enzymes No results found for this basename: CKTOTAL, CKMB, TROPONINI,  in the last 72 hours CBC Lab Results  Component Value Date   WBC 10.8* 01/10/2013   HGB 16.0 01/10/2013   HCT 46.5 01/10/2013   MCV 93.7 01/10/2013   PLT 294.0 01/10/2013   PROTIME: No results found for this basename: LABPROT, INR,  in the last 72 hours Chemistry No results found for this basename: NA, K, CL, CO2, BUN, CREATININE, CALCIUM, LABALBU, PROT, BILITOT, ALKPHOS, ALT, AST, GLUCOSE,  in the last 168 hours Lipids Lab Results  Component Value Date   CHOL 122 10/22/2008   HDL 31.80* 10/22/2008   LDLCALC 76 10/22/2008   TRIG 73.0 10/22/2008   BNP No results found for this basename: probnp   Miscellaneous Lab Results  Component Value Date   DDIMER 0.46 10/10/2011    Radiology/Studies:  No results found.  EKG: atrial fib  Assessment and Plan:  Ernest [name]

## 2013-02-19 NOTE — Assessment & Plan Note (Signed)
As above.

## 2013-02-19 NOTE — Patient Instructions (Addendum)
Stop Cardizem for 2 weeks  Your physician has recommended you make the following change in your medication in two weeks: Try the following medications one at a time - do not take more than one of these at a time 1) Atenolol 50 mg daily for week/two, if not tolerating take 2) Nadolol 40 mg daily for two weeks, if not tolerating then take 3) Inderal LA 80 mg daily for two weeks, if not tolerating then take 4) Metoprolol Succinate 50 mg daily for two weeks   Please call us once you have found medication that you can tolerate  Your physician recommends that you schedule a follow-up appointment in: 3 months with Dr. Graciela Husbands.

## 2013-02-21 DIAGNOSIS — R0989 Other specified symptoms and signs involving the circulatory and respiratory systems: Secondary | ICD-10-CM

## 2013-02-21 DIAGNOSIS — E785 Hyperlipidemia, unspecified: Secondary | ICD-10-CM

## 2013-02-21 DIAGNOSIS — R0609 Other forms of dyspnea: Secondary | ICD-10-CM

## 2013-02-21 DIAGNOSIS — R5383 Other fatigue: Secondary | ICD-10-CM

## 2013-02-21 DIAGNOSIS — I4891 Unspecified atrial fibrillation: Secondary | ICD-10-CM

## 2013-02-21 DIAGNOSIS — R5381 Other malaise: Secondary | ICD-10-CM

## 2013-02-21 DIAGNOSIS — G4733 Obstructive sleep apnea (adult) (pediatric): Secondary | ICD-10-CM

## 2013-02-22 NOTE — Procedures (Signed)
NAME:  Ernest [name], Ernest [name]                  ACCOUNT NO.:  [id number]  MEDICAL RECORD NO.:  [id number]          PATIENT TYPE:  OUT  LOCATION:  SLEEP CENTER                 [facility]ACILITY[facility]  [facility]  PHYSICIAN:  Barbaraann Share, MD,FCCPDATE OF BIRTH:  1940/08/06  DATE OF STUDY:  02/11/2013                           NOCTURNAL POLYSOMNOGRAM  REFERRING PHYSICIAN:  Tereso Newcomer, PA-C  INDICATION FOR STUDY:  Hypersomnia with sleep apnea.  EPWORTH SLEEPINESS SCORE:  5.  MEDICATIONS:  SLEEP ARCHITECTURE:  The patient had a total sleep time of 199 minutes with no slow-wave sleep and only 34 minutes of REM.  Sleep onset latency was normal at 21 minutes, and REM onset was normal at 9 minutes.  Sleep efficiency was poor at 55%.  RESPIRATORY DATA:  The patient was found to have no obstructive apneas and 2 obstructive hypopneas, giving him an apnea-hypopnea index of only 0.6 events per hour.  There was moderate snoring noted throughout, and a small numbers of events noted were not positional.  The patient did not meet split-night criteria because of small numbers of events.  OXYGEN DATA:  There was O2 desaturation as low as 93%.  CARDIAC DATA:  The patient noted to be in atrial fibrillation with a controlled ventricular response throughout the night.  MOVEMENT-PARASOMNIA:  The patient was found to have 398 limb movements, with 2 per hour resulting in arousal or awakening.  There were no abnormal behaviors noted.  IMPRESSIONS-RECOMMENDATIONS: 1. Small numbers of obstructive events which do not meet the apnea-     hypopnea index criteria for the obstructive sleep apnea syndrome. 2. Atrial fibrillation with a controlled ventricular response. 3. Very large numbers of periodic limb movements, with some degree of     sleep disruption.  This may be secondary to a primary movement     disorder of sleep.  Clinical correlation is suggested.     Barbaraann Share, MD,FCCP Diplomate, American Board of  Sleep Medicine    [facility]/MEDQ  D:  02/21/2013 14:39:29  T:  02/22/2013 10:01:41  Job:  [id number]

## 2013-03-05 ENCOUNTER — Telehealth: Payer: Self-pay | Admitting: Internal Medicine

## 2013-03-05 NOTE — Telephone Encounter (Signed)
Will discuss with Dr. Graciela Husbands when he is back in the office. I explained this to pt's wife who is agreeable to plan.

## 2013-03-05 NOTE — Telephone Encounter (Signed)
New message   Pt wife has question about appt with dr baterrow.  Checking on the status of sleep study appt

## 2013-03-06 NOTE — Telephone Encounter (Signed)
s/w pt's wife about the results of the sleep study. I apologized I was off for 6 days and just came back to work after the Christmas holiday. Wife is aware of the results and recommendation to f/u w/PCP per Dr. Shelle Iron for poss. restless leg syndrome. I will mail results to pt today to take to PCP. Wife verbalized understanding to results and to Plan of Care.

## 2013-03-12 ENCOUNTER — Telehealth: Payer: Self-pay | Admitting: Internal Medicine

## 2013-03-12 NOTE — Telephone Encounter (Signed)
Discussed with wife conversation Dr. Graciela HusbandsKlein had with Dr. Jeral FruitBotero - no anticoagulation. I explained we could, but we don't have any way to be certain there won't be any bleeding problems. Decision at this time is no anticoagulation. Pt wife verbalized understanding and agreeable to plan.

## 2013-03-12 NOTE — Telephone Encounter (Signed)
New message  ° ° °Returning call back to nurse.  °

## 2013-04-11 ENCOUNTER — Ambulatory Visit: Payer: Medicare Other | Admitting: Cardiovascular Disease

## 2013-05-23 ENCOUNTER — Encounter: Payer: Self-pay | Admitting: Internal Medicine

## 2013-05-23 ENCOUNTER — Ambulatory Visit (INDEPENDENT_AMBULATORY_CARE_PROVIDER_SITE_OTHER): Payer: Medicare Other | Admitting: Internal Medicine

## 2013-05-23 ENCOUNTER — Telehealth: Payer: Self-pay | Admitting: *Deleted

## 2013-05-23 VITALS — BP 123/76 | HR 76 | Ht 70.0 in | Wt 167.0 lb

## 2013-05-23 DIAGNOSIS — I4891 Unspecified atrial fibrillation: Secondary | ICD-10-CM

## 2013-05-23 MED ORDER — ATENOLOL 100 MG PO TABS: 100.0000 mg | ORAL_TABLET | Freq: Every day | ORAL | Status: DC

## 2013-05-23 MED ORDER — FUROSEMIDE 20 MG PO TABS: 20.0000 mg | ORAL_TABLET | ORAL | Status: DC

## 2013-05-23 NOTE — Telephone Encounter (Signed)
Called pt to inform them of GXT reschedule to 07/04/13 at 1:30 w/ Graciela HusbandsKlein, be here by 1:15 pm. I will also reschedule BMET for day of GXT. They are agreeable to plan.

## 2013-05-23 NOTE — Patient Instructions (Signed)
Your physician has recommended you make the following change in your medication:  1) Increase Atenolol to 100 mg daily 2) Wait two weeks after starting Atenolol to begin Lasix 20 mg every other day  Your physician has requested that you have an exercise tolerance test. For further information please visit [email or web address]. Please also follow instruction sheet, as given.  Your physician has requested that you have a carotid duplex. This test is an ultrasound of the carotid arteries in your neck. It looks at blood flow through these arteries that supply the brain with blood. Allow one hour for this exam. There are no restrictions or special instructions.  Your physician recommends that you return for lab work in: BMET when you come in for exercise tolerance test

## 2013-05-23 NOTE — Progress Notes (Signed)
Patient Care Team: Ernest SallesKathy L Patterson, FNP as PCP - General (Nurse Practitioner)   HPI  Ernest HamperCecil [name] is a 73 y.o. male Seen in followup for atrial fibrillation which is permanent associated with some degree of rapid ventricular rates and exercise intolerance. At his last visit, December 14, he was given a prescription for multiple beta blockers to see if we can identify one that would be helpful.  He also has a history of a prior brain aneurysm and was on antiplatelet therapy; he was to follow up with neurosurgery regarding the use of apixaban  they thought they anticoagulation would not be appropriate.  The patient noted no changes with beta blockers in terms of side effects; also noted no improvement in functional status.  Echocardiogram had demonstrated normal left ventricular function left atrial enlargement and diastolic dysfunction with left ventricular hypertrophy  Snoring and daytime fatigue prompted a sleep study which was negative  Past Medical History  Diagnosis Date  . Hyperlipidemia   . Brain aneurysm   . TIA (transient ischemic attack)   . Coronary artery disease     a. nonobstructive CAD by cath in the past;  b.  Lexiscan Myoview (10/2011): No scar or ischemia, EF 75% (normal study).  . Atrial fibrillation     not on coumadin due to hx of intracranial bleed  . Hx of echocardiogram     a. Echo 10/04/11: mild LVH, EF 55-60%, mild MR, mod to severe LAE, mod RAE, PASP 23.   Marland Kitchen. Hypertension     Past Surgical History  Procedure Laterality Date  . Appendectomy      Current Outpatient Prescriptions  Medication Sig Dispense Refill  . AGGRENOX 25-200 MG per 12 hr capsule TAKE ONE CAPSULE BY MOUTH TWICE A DAY  60 capsule  5  . atenolol (TENORMIN) 50 MG tablet Take 1 tablet (50 mg total) by mouth daily.  30 tablet  0  . Coenzyme Q10 (CO Q 10 PO) Take 1 capsule by mouth every morning.      . metoprolol succinate (TOPROL-XL) 50 MG 24 hr tablet Take 1 tablet (50 mg total)  by mouth daily. Take with or immediately following a meal.  30 tablet  0  . Multiple Vitamin (MULTIVITAMIN) capsule Take 1 capsule by mouth daily.        . nadolol (CORGARD) 40 MG tablet Take 1 tablet (40 mg total) by mouth daily.  30 tablet  0  . propranolol ER (INDERAL LA) 80 MG 24 hr capsule Take 1 capsule (80 mg total) by mouth daily.  30 capsule  0  . rosuvastatin (CRESTOR) 5 MG tablet Take 1 tablet (5 mg total) by mouth daily.  30 tablet  6  . TURMERIC PO Take by mouth.      . vitamin C (ASCORBIC ACID) 500 MG tablet Take 500 mg by mouth daily.         No current facility-administered medications for this visit.    Allergies  Allergen Reactions  . Atorvastatin Other (See Comments)    REACTION: Muscle cramps  . Simvastatin Other (See Comments)    REACTION: Muscle cramps    Review of Systems negative except from HPI and PMH  Physical Exam BP 123/76  Pulse 76  Ht 5\' 10"  (1.778 m) Well developed and well nourished in no acute distress HENT normal E scleral and icterus clear Neck Supple JVP 8-9  carotids brisk and full Clear to ausculation  Regular rate and rhythm,  no murmurs gallops or rub Soft with active bowel sounds No clubbing cyanosis  Edema Alert and oriented, grossly normal motor and sensory function Skin Warm and Dry    Assessment and  Plan  Atrial fibrillation-permanent  Intracranial aneurysm-precluding anticoagulation  Dyspnea on exertion   the patient has atrial fibrillation with a rapid rate. He tolerated the beta blockers and so we'll increase his atenolol from 50--100 mg. In about 4 weeks we'll undertake treadmill testing to see whether we have adequate control of exercise-associated heart rates.  About 2 weeks prior to that treadmill, we will introduce diuretics Lasix 20 mg every other day to see if in the context of diastolic dysfunction atrial fibrillation we can't improve symptoms  Per neurosurgery is not a candidate for anticoagulation

## 2013-06-04 ENCOUNTER — Other Ambulatory Visit (INDEPENDENT_AMBULATORY_CARE_PROVIDER_SITE_OTHER): Payer: Medicare Other

## 2013-06-04 ENCOUNTER — Encounter: Payer: Medicare Other | Admitting: Internal Medicine

## 2013-06-04 ENCOUNTER — Ambulatory Visit (HOSPITAL_COMMUNITY): Payer: Medicare Other | Attending: Internal Medicine | Admitting: Cardiology

## 2013-06-04 DIAGNOSIS — R0989 Other specified symptoms and signs involving the circulatory and respiratory systems: Secondary | ICD-10-CM | POA: Insufficient documentation

## 2013-06-04 DIAGNOSIS — I658 Occlusion and stenosis of other precerebral arteries: Secondary | ICD-10-CM | POA: Insufficient documentation

## 2013-06-04 DIAGNOSIS — I671 Cerebral aneurysm, nonruptured: Secondary | ICD-10-CM

## 2013-06-04 DIAGNOSIS — I4891 Unspecified atrial fibrillation: Secondary | ICD-10-CM

## 2013-06-04 DIAGNOSIS — Z8673 Personal history of transient ischemic attack (TIA), and cerebral infarction without residual deficits: Secondary | ICD-10-CM | POA: Insufficient documentation

## 2013-06-04 DIAGNOSIS — I6529 Occlusion and stenosis of unspecified carotid artery: Secondary | ICD-10-CM | POA: Insufficient documentation

## 2013-06-04 DIAGNOSIS — E785 Hyperlipidemia, unspecified: Secondary | ICD-10-CM | POA: Insufficient documentation

## 2013-06-04 DIAGNOSIS — Z87891 Personal history of nicotine dependence: Secondary | ICD-10-CM | POA: Insufficient documentation

## 2013-06-04 LAB — BASIC METABOLIC PANEL
BUN: 19 mg/dL (ref 6–23)
CALCIUM: 9 mg/dL (ref 8.4–10.5)
CO2: 31 mEq/L (ref 19–32)
CREATININE: 1.2 mg/dL (ref 0.4–1.5)
Chloride: 103 mEq/L (ref 96–112)
GFR: 61.38 mL/min (ref >60.00)
GLUCOSE: 74 mg/dL (ref 70–99)
Potassium: 3.9 mEq/L (ref 3.5–5.1)
Sodium: 140 mEq/L (ref 135–145)

## 2013-06-04 NOTE — Progress Notes (Signed)
Carotid duplex completed 

## 2013-06-10 ENCOUNTER — Other Ambulatory Visit: Payer: Self-pay | Admitting: Cardiovascular Disease

## 2013-06-13 ENCOUNTER — Telehealth: Payer: Self-pay | Admitting: Internal Medicine

## 2013-06-13 NOTE — Telephone Encounter (Signed)
New message ° ° ° ° °Want carotid results °

## 2013-06-13 NOTE — Telephone Encounter (Signed)
Spoke with Pt's wife (she states "my Husband does not hear well, give me the results and I will tell him.")  Advised Pt's wife of carotid duplex results and to continue on with current treatment regimen. Told wife that this test will be repeated in 1 year, and a scheduler will contact them to arrange this.

## 2013-07-04 ENCOUNTER — Ambulatory Visit (INDEPENDENT_AMBULATORY_CARE_PROVIDER_SITE_OTHER): Payer: Medicare Other | Admitting: Internal Medicine

## 2013-07-04 ENCOUNTER — Ambulatory Visit (INDEPENDENT_AMBULATORY_CARE_PROVIDER_SITE_OTHER): Payer: Medicare Other | Admitting: *Deleted

## 2013-07-04 DIAGNOSIS — I671 Cerebral aneurysm, nonruptured: Secondary | ICD-10-CM

## 2013-07-04 DIAGNOSIS — R5381 Other malaise: Secondary | ICD-10-CM

## 2013-07-04 DIAGNOSIS — I4891 Unspecified atrial fibrillation: Secondary | ICD-10-CM

## 2013-07-04 DIAGNOSIS — R5383 Other fatigue: Secondary | ICD-10-CM

## 2013-07-04 DIAGNOSIS — R0600 Dyspnea, unspecified: Secondary | ICD-10-CM

## 2013-07-04 DIAGNOSIS — R0989 Other specified symptoms and signs involving the circulatory and respiratory systems: Secondary | ICD-10-CM

## 2013-07-04 DIAGNOSIS — Z8679 Personal history of other diseases of the circulatory system: Secondary | ICD-10-CM

## 2013-07-04 DIAGNOSIS — R0609 Other forms of dyspnea: Secondary | ICD-10-CM

## 2013-07-04 DIAGNOSIS — E785 Hyperlipidemia, unspecified: Secondary | ICD-10-CM

## 2013-07-04 LAB — BASIC METABOLIC PANEL
BUN: 21 mg/dL (ref 6–23)
CALCIUM: 9.1 mg/dL (ref 8.4–10.5)
CO2: 30 mEq/L (ref 19–32)
CREATININE: 1.4 mg/dL (ref 0.4–1.5)
Chloride: 101 mEq/L (ref 96–112)
GFR: 51.99 mL/min — ABNORMAL LOW (ref >60.00)
Glucose, Bld: 138 mg/dL — ABNORMAL HIGH (ref 70–99)
Potassium: 4.1 mEq/L (ref 3.5–5.1)
Sodium: 138 mEq/L (ref 135–145)

## 2013-07-04 NOTE — Progress Notes (Signed)
Exercise Treadmill Test  Pre-Exercise Testing Evaluation Rhythm: atrial fibrillation  Rate control assessment Rate: 75 bpm     Test  Exercise Tolerance Test Ordering MD: Sherryl MangesSteven Klein, MD  Interpreting MD: Sherryl MangesSteven Klein, MD  Unique Test No: 1  Treadmill:  1  Indication for ETT: afib  Contraindication to ETT: No   Stress Modality: exercise - treadmill  Cardiac Imaging Performed: non   Protocol: standard Bruce - maximal  Max BP:    Max MPHR (bpm):  148 85% MPR (bpm):  126  MPHR obtained (bpm):   % MPHR obtained:    Reached 85% MPHR (min:sec):   Total Exercise Time (min-sec):    Workload in METS:   Borg Scale:   Reason ETT Terminated:      ST Segment Analysis At Rest:  With Exercise:   Other Information Arrhythmia:   Angina during ETT:   Quality of ETT:    ETT Interpretation:  Max HR 93   Comments:    Recommendations:  adequate rate control

## 2013-07-23 ENCOUNTER — Ambulatory Visit (HOSPITAL_COMMUNITY): Payer: Medicare Other | Attending: Cardiovascular Disease | Admitting: Radiology

## 2013-07-23 DIAGNOSIS — R0989 Other specified symptoms and signs involving the circulatory and respiratory systems: Secondary | ICD-10-CM | POA: Insufficient documentation

## 2013-07-23 DIAGNOSIS — R0609 Other forms of dyspnea: Secondary | ICD-10-CM | POA: Insufficient documentation

## 2013-07-23 DIAGNOSIS — I4891 Unspecified atrial fibrillation: Secondary | ICD-10-CM | POA: Insufficient documentation

## 2013-07-23 DIAGNOSIS — R0600 Dyspnea, unspecified: Secondary | ICD-10-CM

## 2013-07-23 NOTE — Progress Notes (Signed)
Echocardiogram performed.  

## 2013-08-03 ENCOUNTER — Other Ambulatory Visit: Payer: Self-pay | Admitting: Cardiovascular Disease

## 2013-08-06 ENCOUNTER — Other Ambulatory Visit: Payer: Self-pay | Admitting: Physician Assistant

## 2013-08-21 ENCOUNTER — Ambulatory Visit (HOSPITAL_COMMUNITY)
Admission: RE | Admit: 2013-08-21 | Discharge: 2013-08-21 | Disposition: A | Discharge status: Home / Self Care | Payer: Medicare Other | Source: Ambulatory Visit | Attending: Nurse Practitioner | Admitting: Nurse Practitioner

## 2013-08-21 ENCOUNTER — Other Ambulatory Visit (HOSPITAL_COMMUNITY): Payer: Self-pay | Admitting: Nurse Practitioner

## 2013-08-21 DIAGNOSIS — F17200 Nicotine dependence, unspecified, uncomplicated: Secondary | ICD-10-CM

## 2013-08-21 DIAGNOSIS — I4891 Unspecified atrial fibrillation: Secondary | ICD-10-CM | POA: Insufficient documentation

## 2013-08-21 DIAGNOSIS — R05 Cough: Secondary | ICD-10-CM | POA: Insufficient documentation

## 2013-08-21 DIAGNOSIS — R059 Cough, unspecified: Secondary | ICD-10-CM | POA: Insufficient documentation

## 2013-08-26 ENCOUNTER — Other Ambulatory Visit (HOSPITAL_COMMUNITY): Payer: Self-pay | Admitting: Nurse Practitioner

## 2013-08-26 DIAGNOSIS — J8410 Pulmonary fibrosis, unspecified: Secondary | ICD-10-CM

## 2013-09-01 ENCOUNTER — Other Ambulatory Visit: Payer: Self-pay | Admitting: Physician Assistant

## 2013-09-03 ENCOUNTER — Ambulatory Visit (HOSPITAL_COMMUNITY)
Admission: RE | Admit: 2013-09-03 | Discharge: 2013-09-03 | Disposition: A | Discharge status: Home / Self Care | Payer: Medicare Other | Source: Ambulatory Visit | Attending: Nurse Practitioner | Admitting: Nurse Practitioner

## 2013-09-03 ENCOUNTER — Other Ambulatory Visit (HOSPITAL_COMMUNITY): Payer: Self-pay | Admitting: Nurse Practitioner

## 2013-09-03 ENCOUNTER — Encounter (HOSPITAL_COMMUNITY): Payer: Self-pay

## 2013-09-03 DIAGNOSIS — E049 Nontoxic goiter, unspecified: Secondary | ICD-10-CM | POA: Insufficient documentation

## 2013-09-03 DIAGNOSIS — J8410 Pulmonary fibrosis, unspecified: Secondary | ICD-10-CM

## 2013-09-03 DIAGNOSIS — R911 Solitary pulmonary nodule: Secondary | ICD-10-CM | POA: Insufficient documentation

## 2013-09-03 DIAGNOSIS — I2510 Atherosclerotic heart disease of native coronary artery without angina pectoris: Secondary | ICD-10-CM | POA: Insufficient documentation

## 2013-09-03 NOTE — Progress Notes (Signed)
Called and spoke with NP Ernest [name], she did not want hi res protocol stated that ct chest w/o was not for pulmonary fibrosis

## 2013-09-14 ENCOUNTER — Other Ambulatory Visit: Payer: Self-pay | Admitting: Cardiovascular Disease

## 2013-10-15 ENCOUNTER — Other Ambulatory Visit: Payer: Self-pay | Admitting: Cardiovascular Disease

## 2013-10-16 ENCOUNTER — Other Ambulatory Visit: Payer: Self-pay | Admitting: *Deleted

## 2013-10-16 MED ORDER — ATENOLOL 100 MG PO TABS: 100.0000 mg | ORAL_TABLET | Freq: Every day | ORAL | Status: DC

## 2013-10-16 MED ORDER — ROSUVASTATIN CALCIUM 5 MG PO TABS: ORAL_TABLET | ORAL | Status: DC

## 2013-10-16 MED ORDER — FUROSEMIDE 20 MG PO TABS: 20.0000 mg | ORAL_TABLET | ORAL | Status: DC

## 2013-10-16 NOTE — Telephone Encounter (Signed)
OK to refill at this time but further refills should be by Dr. Graciela HusbandsKlein who is currently following pt

## 2013-10-16 NOTE — Telephone Encounter (Signed)
Is this ok to refill? Please advise. Thanks, MI 

## 2013-10-17 ENCOUNTER — Other Ambulatory Visit: Payer: Self-pay

## 2013-10-17 MED ORDER — ASPIRIN-DIPYRIDAMOLE ER 25-200 MG PO CP12
ORAL_CAPSULE | ORAL | Status: DC
Start: 2013-10-17 — End: 2014-02-14

## 2013-10-21 ENCOUNTER — Other Ambulatory Visit: Payer: Self-pay | Admitting: Internal Medicine

## 2013-11-07 ENCOUNTER — Other Ambulatory Visit: Payer: Self-pay | Admitting: Internal Medicine

## 2013-11-08 ENCOUNTER — Other Ambulatory Visit: Payer: Self-pay

## 2013-11-14 ENCOUNTER — Telehealth: Payer: Self-pay | Admitting: Internal Medicine

## 2013-11-14 ENCOUNTER — Encounter: Payer: Self-pay | Admitting: *Deleted

## 2013-11-14 NOTE — Telephone Encounter (Signed)
I informed patient of wife's request and he is unsure of what referral she is asking about.  I explained to him that I did not know either. She will return this afternoon and I agreed to call back later.

## 2013-11-14 NOTE — Telephone Encounter (Signed)
New message    Wife calling checking - of referral diagnostic MD.

## 2013-11-14 NOTE — Telephone Encounter (Signed)
Follow up ° ° ° ° °Returning a nurses call °

## 2013-11-15 NOTE — Telephone Encounter (Signed)
Patient's wife states that at last office visit with Dr. Graciela Husbands he mentioned testing and if negative maybe seeing some sort of diagnostic doctor, but she cannot remember exactly which kind of doctor.  She knew testing came back ok but wanted to know about this doctor Dr. Graciela Husbands mentioned. I explained that I don't see this mentioned in office note, so I would speak with him when he is back in the office on 9/22 about this (and route message to him). She is agreeable and ok waiting.  She also mentions that pt is tired all the time and sleeps a lot. She is going to contact a doctor ("one that deal with older people" was her exact words) to evaluate him.

## 2013-11-16 ENCOUNTER — Other Ambulatory Visit: Payer: Self-pay | Admitting: Internal Medicine

## 2013-11-19 ENCOUNTER — Other Ambulatory Visit: Payer: Self-pay | Admitting: Internal Medicine

## 2013-11-25 NOTE — Telephone Encounter (Signed)
i dont remember from that many months ago  Is there a specific question we could help with

## 2013-11-27 NOTE — Telephone Encounter (Signed)
Informed pt's wife of Dr. Odessa Fleming response. No specific question/s at this time.

## 2013-12-20 DIAGNOSIS — I5022 Chronic systolic (congestive) heart failure: Secondary | ICD-10-CM | POA: Insufficient documentation

## 2013-12-20 DIAGNOSIS — E7800 Pure hypercholesterolemia, unspecified: Secondary | ICD-10-CM | POA: Insufficient documentation

## 2014-01-22 DIAGNOSIS — F3341 Major depressive disorder, recurrent, in partial remission: Secondary | ICD-10-CM | POA: Insufficient documentation

## 2014-02-14 ENCOUNTER — Other Ambulatory Visit: Payer: Self-pay

## 2014-02-14 ENCOUNTER — Other Ambulatory Visit: Payer: Self-pay | Admitting: Cardiovascular Disease

## 2014-02-14 MED ORDER — ASPIRIN-DIPYRIDAMOLE ER 25-200 MG PO CP12: 1.0000 | ORAL_CAPSULE | Freq: Two times a day (BID) | ORAL | Status: DC

## 2014-02-24 DIAGNOSIS — F0280 Dementia in other diseases classified elsewhere without behavioral disturbance: Secondary | ICD-10-CM | POA: Insufficient documentation

## 2014-02-24 DIAGNOSIS — G301 Alzheimer's disease with late onset: Secondary | ICD-10-CM

## 2014-03-11 ENCOUNTER — Other Ambulatory Visit: Payer: Self-pay | Admitting: Cardiovascular Disease

## 2014-03-20 ENCOUNTER — Other Ambulatory Visit: Payer: Self-pay | Admitting: Cardiovascular Disease

## 2014-03-26 ENCOUNTER — Other Ambulatory Visit: Payer: Self-pay | Admitting: Cardiovascular Disease

## 2014-03-27 ENCOUNTER — Other Ambulatory Visit: Payer: Self-pay | Admitting: Cardiovascular Disease

## 2014-03-30 ENCOUNTER — Other Ambulatory Visit: Payer: Self-pay | Admitting: Cardiovascular Disease

## 2014-04-07 ENCOUNTER — Other Ambulatory Visit: Payer: Self-pay | Admitting: Internal Medicine

## 2014-05-09 IMAGING — CR DG CHEST 2V
2 series · 2 of 2 positions shown · non-contrast
Comparison: 09/22/2011

CLINICAL DATA: Shortness of breath

CHEST - 2 VIEW

[w chest pa]
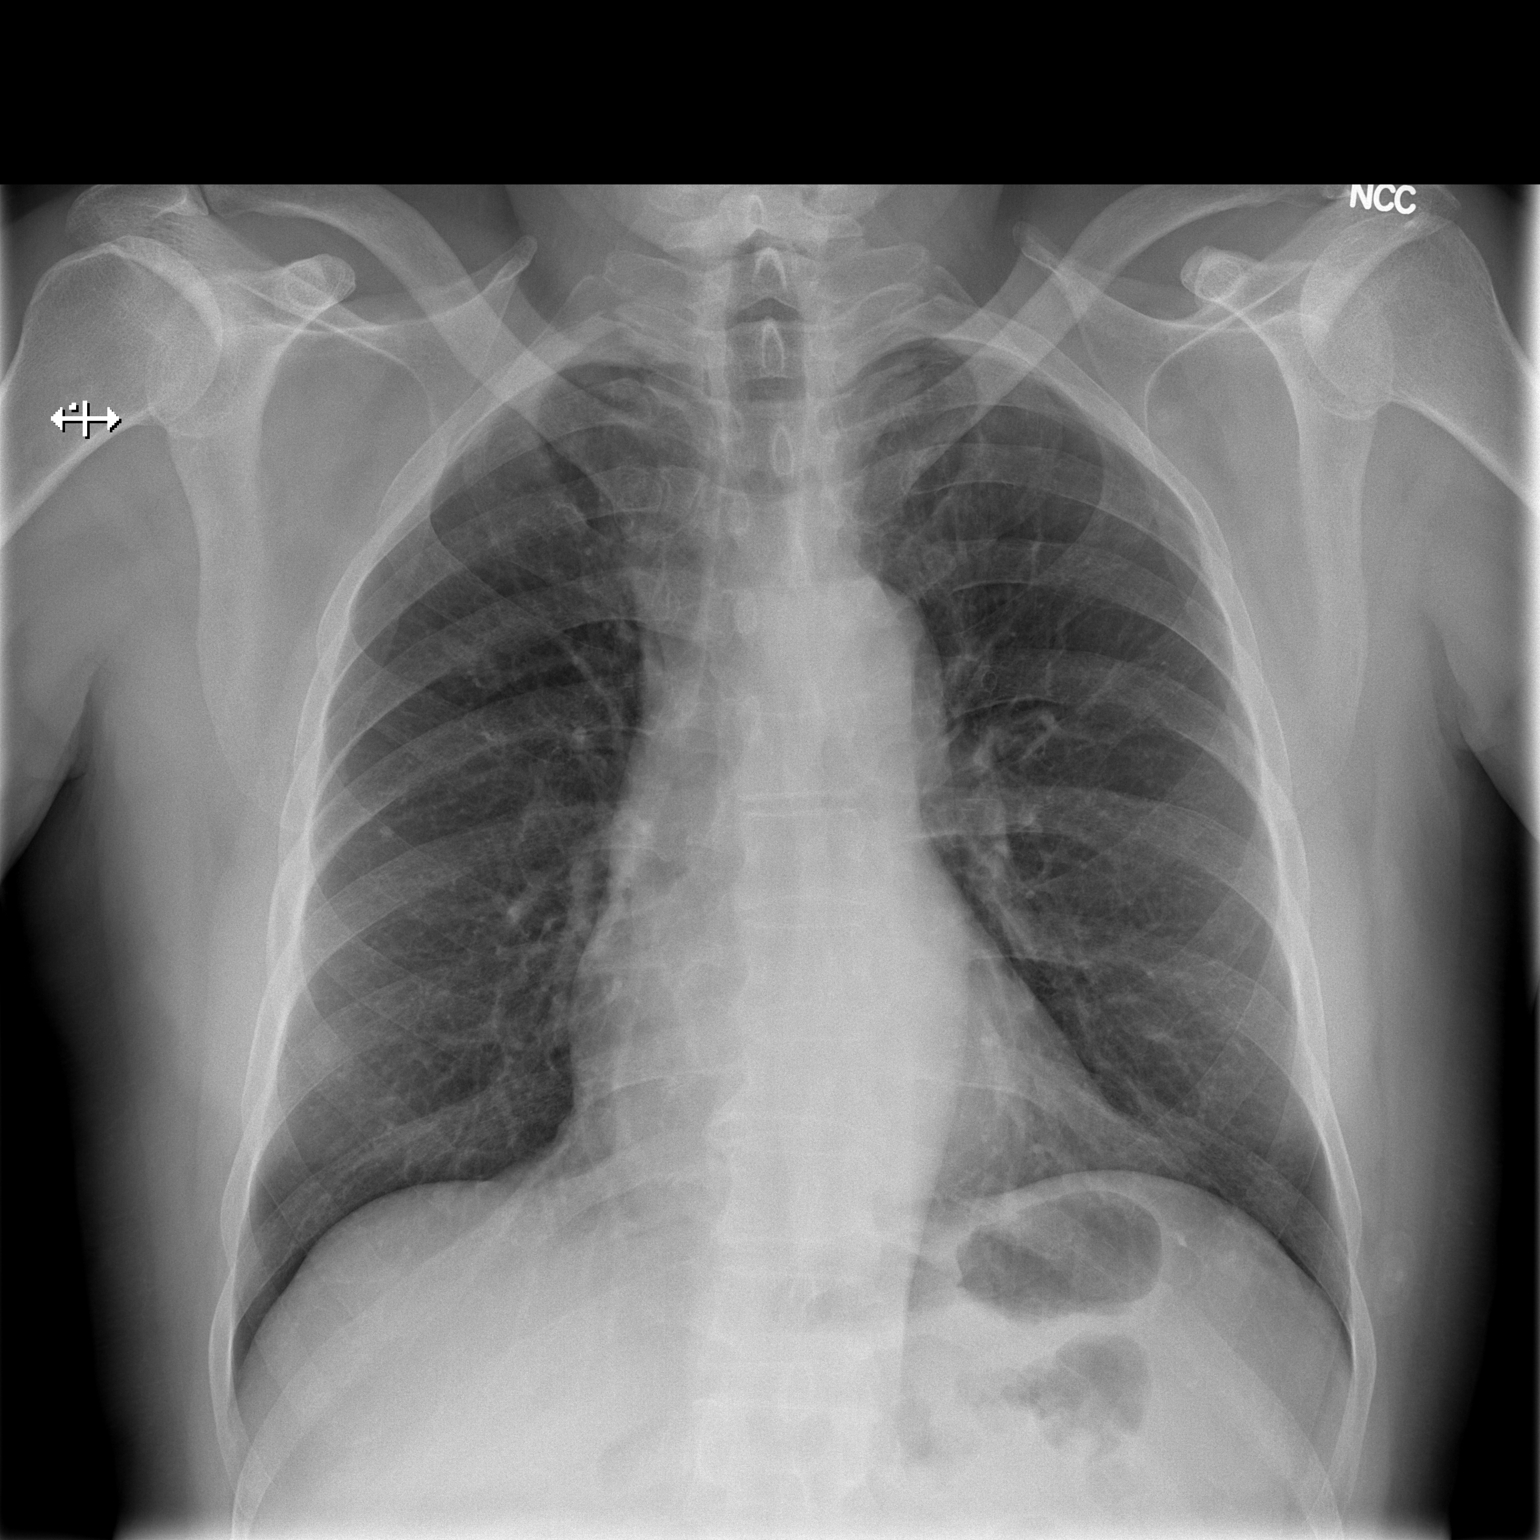

[w chest lat]
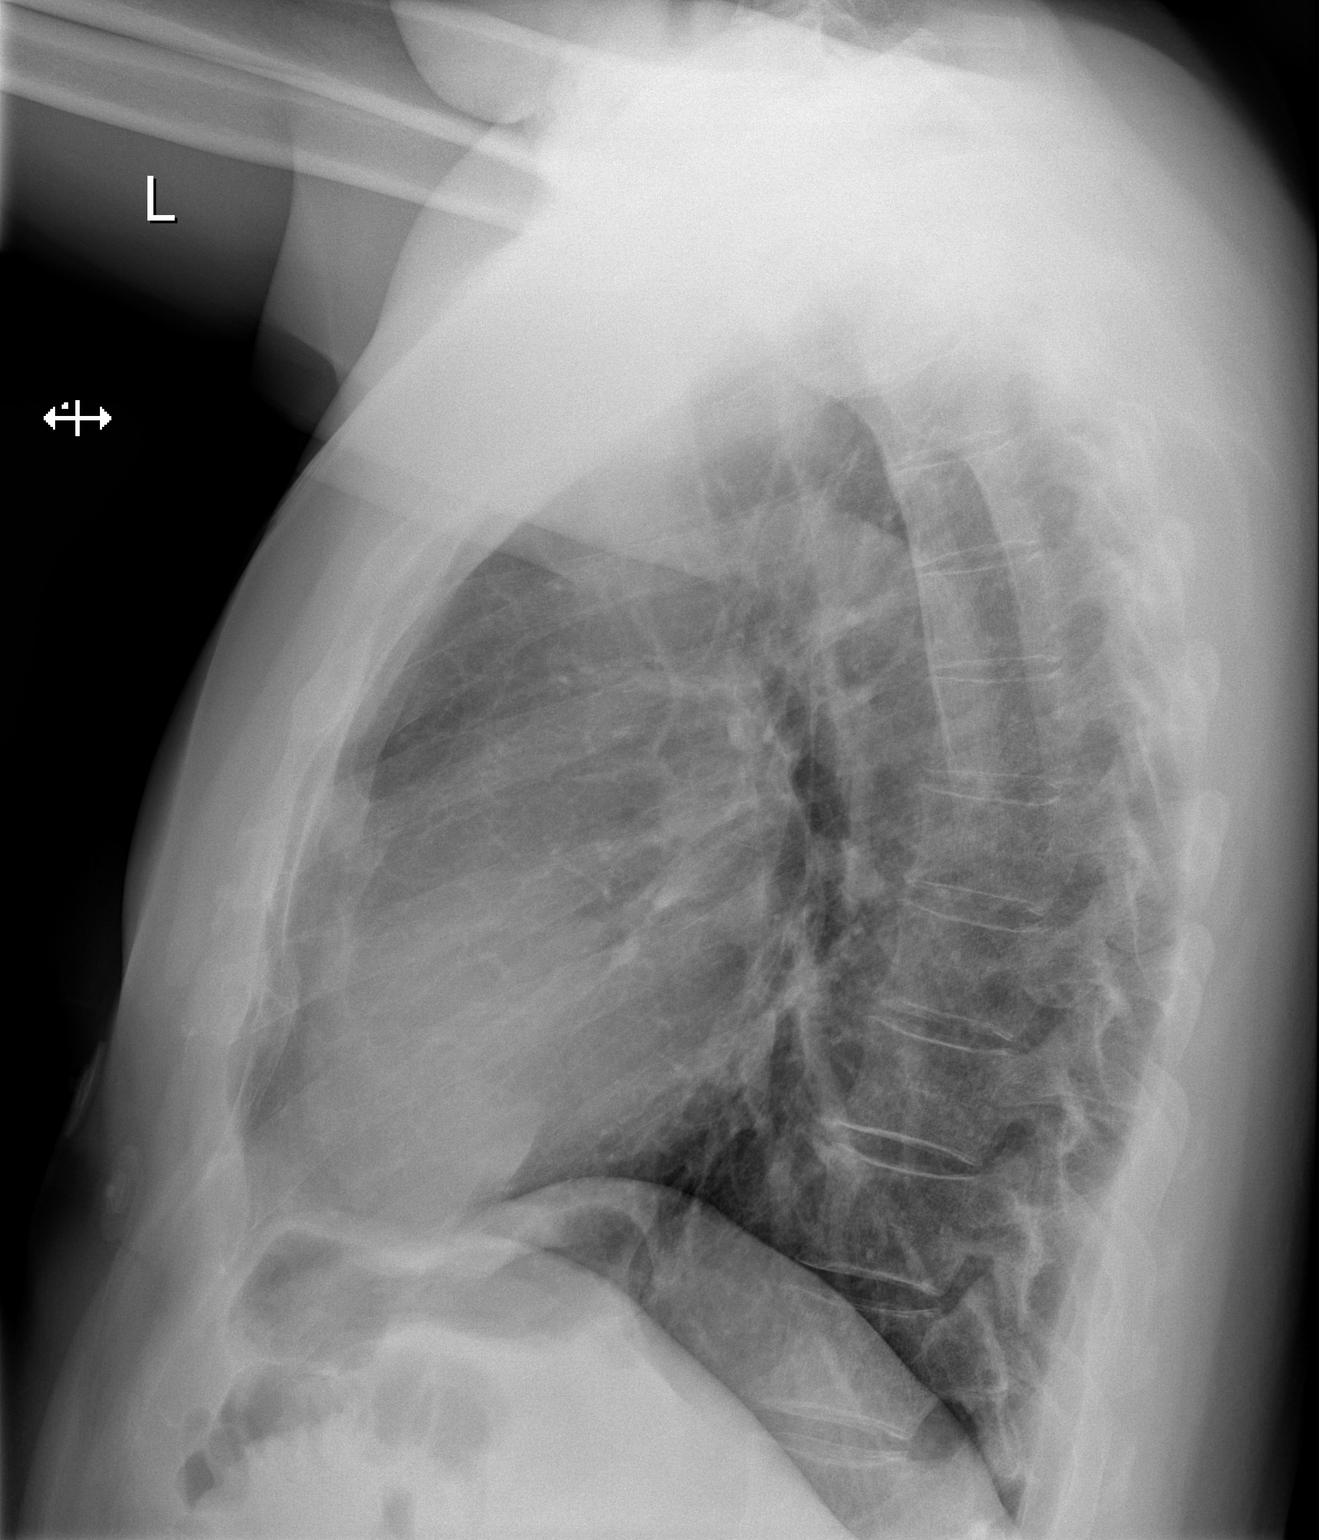

[2 of 2 positions shown; findings below may reference images not displayed]

FINDINGS: Cardiomediastinal silhouette is stable.  No acute
infiltrate or pleural effusion.  No pulmonary edema.  Stable
probable small granuloma right midlung.  Stable mild degenerative
changes lower thoracic spine.
IMPRESSION: No active disease.  No significant change.

## 2014-05-10 ENCOUNTER — Other Ambulatory Visit: Payer: Self-pay | Admitting: Internal Medicine

## 2014-05-27 ENCOUNTER — Other Ambulatory Visit (HOSPITAL_COMMUNITY): Payer: Self-pay | Admitting: Cardiology

## 2014-05-27 DIAGNOSIS — I6523 Occlusion and stenosis of bilateral carotid arteries: Secondary | ICD-10-CM

## 2014-06-04 ENCOUNTER — Ambulatory Visit: Payer: Self-pay | Admitting: Internal Medicine

## 2014-06-05 ENCOUNTER — Encounter (HOSPITAL_COMMUNITY): Payer: Self-pay

## 2014-06-05 ENCOUNTER — Ambulatory Visit (HOSPITAL_COMMUNITY): Payer: Medicare Other | Attending: Cardiology | Admitting: Cardiology

## 2014-06-05 DIAGNOSIS — Z8673 Personal history of transient ischemic attack (TIA), and cerebral infarction without residual deficits: Secondary | ICD-10-CM | POA: Diagnosis not present

## 2014-06-05 DIAGNOSIS — E785 Hyperlipidemia, unspecified: Secondary | ICD-10-CM | POA: Diagnosis not present

## 2014-06-05 DIAGNOSIS — I6523 Occlusion and stenosis of bilateral carotid arteries: Secondary | ICD-10-CM | POA: Diagnosis not present

## 2014-06-05 DIAGNOSIS — Z87891 Personal history of nicotine dependence: Secondary | ICD-10-CM | POA: Insufficient documentation

## 2014-06-05 NOTE — Progress Notes (Signed)
Carotid duplex performed 

## 2014-07-10 ENCOUNTER — Encounter: Payer: Self-pay | Admitting: Internal Medicine

## 2014-07-10 ENCOUNTER — Ambulatory Visit (INDEPENDENT_AMBULATORY_CARE_PROVIDER_SITE_OTHER): Payer: Medicare Other | Admitting: Internal Medicine

## 2014-07-10 VITALS — BP 130/90 | HR 72 | Ht 70.0 in | Wt 184.2 lb

## 2014-07-10 DIAGNOSIS — I482 Chronic atrial fibrillation: Secondary | ICD-10-CM | POA: Diagnosis not present

## 2014-07-10 DIAGNOSIS — I4821 Permanent atrial fibrillation: Secondary | ICD-10-CM

## 2014-07-10 MED ORDER — ASPIRIN EC 81 MG PO TBEC: 81.0000 mg | DELAYED_RELEASE_TABLET | Freq: Every day | ORAL | Status: DC

## 2014-07-10 NOTE — Progress Notes (Signed)
Patient Care Team: Ernest RegulusMarshall W Anderson, MD as PCP - General (Internal Medicine)   HPI  Ernest [name] is a 74 y.o. male Seen in followup for atrial fibrillation which is permanent associated with some degree of rapid ventricular rates and exercise intolerance. At his last visit, December 14, he was given a prescription for multiple beta blockers to see if we can identify one that would be helpful.  He also has a history of a prior brain aneurysm and was on antiplatelet therapy; he was to follow up with neurosurgery regarding the use of apixaban  they thought they anticoagulation would not be appropriate.  The patient noted no changes with beta blockers in terms of side effects; also noted no improvement in functional status.  He continues to get increasingly confused and lethargy remains an issue. His wife is concerned about the contributions of various medications.  Echocardiogram had demonstrated normal left ventricular function left atrial enlargement and diastolic dysfunction with left ventricular hypertrophy    Past Medical History  Diagnosis Date  . Hyperlipidemia   . Brain aneurysm   . TIA (transient ischemic attack)   . Coronary artery disease     a. nonobstructive CAD by cath in the past;  b.  Lexiscan Myoview (10/2011): No scar or ischemia, EF 75% (normal study).  . Atrial fibrillation     not on coumadin due to hx of intracranial bleed  . Hx of echocardiogram     a. Echo 10/04/11: mild LVH, EF 55-60%, mild MR, mod to severe LAE, mod RAE, PASP 23.   Marland Kitchen. Hypertension     Past Surgical History  Procedure Laterality Date  . Appendectomy      Current Outpatient Prescriptions  Medication Sig Dispense Refill  . buPROPion (WELLBUTRIN SR) 150 MG 12 hr tablet Take 150 mg by mouth 2 (two) times daily.    . CRESTOR 5 MG tablet TAKE 1 TABLET BY MOUTH EVERY DAY 30 tablet 6  . dipyridamole-aspirin (AGGRENOX) 200-25 MG per 12 hr capsule Take 1 capsule by mouth 2 (two) times  daily. 60 capsule 6  . donepezil (ARICEPT) 5 MG tablet Take 5 mg by mouth at bedtime.    . furosemide (LASIX) 20 MG tablet TAKE 1 TABLET BY MOUTH EVERY OTHER DAY 15 tablet 4  . memantine (NAMENDA) 10 MG tablet Take 10 mg by mouth 2 (two) times daily.    . metoprolol succinate (TOPROL-XL) 50 MG 24 hr tablet Take 1 tablet (50 mg total) by mouth daily. Take with or immediately following a meal. 30 tablet 0  . Multiple Vitamin (MULTIVITAMIN) capsule Take 1 capsule by mouth daily.      . nadolol (CORGARD) 40 MG tablet Take 1 tablet (40 mg total) by mouth daily. 30 tablet 0  . propranolol ER (INDERAL LA) 80 MG 24 hr capsule Take 1 capsule (80 mg total) by mouth daily. 30 capsule 0  . TURMERIC PO Take by mouth.    . vitamin C (ASCORBIC ACID) 500 MG tablet Take 500 mg by mouth daily.       No current facility-administered medications for this visit.    Allergies  Allergen Reactions  . Atorvastatin Other (See Comments)    REACTION: Muscle cramps  . Simvastatin Other (See Comments)    REACTION: Muscle cramps    Review of Systems negative except from HPI and PMH  Physical Exam BP 130/90 mmHg  Pulse 72  Ht 5\' 10"  (1.778 m)  Wt 184  lb 3.2 oz (83.553 kg)  BMI 26.43 kg/m2 Well developed and well nourished in no acute distress HENT normal E scleral and icterus clear Neck Supple   carotids brisk and full Clear to ausculation Irregularly irregular rate and rhythm, no murmurs gallops or rub Soft with active bowel sounds No clubbing cyanosis  Edema Alert  but not oriented grossly normal motor and sensory function Skin Warm and Dry  ECG demonstrates atrial fibrillation at 72 intervals-/09/39  Assessment and  Plan  Atrial fibrillation-permanent  Intracranial aneurysm-precluding anticoagulation  Fatigue  Confusion   the patient has atrial fibrillation with now controlled ventricular rate. This may be further accentuated by the addition of his memory drugs.  He is on low-dose beta  blockers. We will discontinue that at this time.  In 4 weeks, his wife would like to stop dipyridamole. I think that that is a reasonable undertaking. At that juncture, we will begin him on aspirin 81.  I've asked that he follow-up with Dr. Dareen PianoAnderson in 2 months time to review the above.

## 2014-07-10 NOTE — Patient Instructions (Signed)
Medication Instructions:  Your physician has recommended you make the following change in your medication:  1) STOP Metoprolol 2) STOP Aggrenox 1 month after stopping Metoprolol  3) After stopping Aggrenox, START Aspirin 81 mg daily.  Labwork: None ordered  Testing/Procedures: None ordered  Follow-Up: Your physician wants you to follow-up in: 1 year with Dr. Graciela HusbandsKlein.  You will receive a reminder letter in the mail two months in advance. If you don't receive a letter, please call our office to schedule the follow-up appointment.   Thank you for choosing [facility]!!

## 2014-09-16 ENCOUNTER — Ambulatory Visit (INDEPENDENT_AMBULATORY_CARE_PROVIDER_SITE_OTHER): Payer: Medicare Other | Admitting: Urology

## 2014-09-16 ENCOUNTER — Encounter: Payer: Self-pay | Admitting: Urology

## 2014-09-16 VITALS — BP 130/86 | HR 97 | Resp 18 | Ht 70.0 in | Wt 180.3 lb

## 2014-09-16 DIAGNOSIS — R972 Elevated prostate specific antigen [PSA]: Secondary | ICD-10-CM

## 2014-09-16 LAB — URINALYSIS, COMPLETE
Bilirubin, UA: NEGATIVE
Glucose, UA: NEGATIVE
Ketones, UA: NEGATIVE
LEUKOCYTES UA: NEGATIVE
Nitrite, UA: NEGATIVE
PH UA: 6 (ref 5.0–7.5)
PROTEIN UA: NEGATIVE
RBC, UA: NEGATIVE
Specific Gravity, UA: 1.02 (ref 1.005–1.030)
Urobilinogen, Ur: 1 mg/dL (ref 0.2–1.0)

## 2014-09-16 LAB — MICROSCOPIC EXAMINATION: BACTERIA UA: NONE SEEN

## 2014-09-16 LAB — BLADDER SCAN AMB NON-IMAGING: Scan Result: 65

## 2014-09-16 MED ORDER — TAMSULOSIN HCL 0.4 MG PO CAPS: 0.4000 mg | ORAL_CAPSULE | Freq: Every day | ORAL | Status: DC

## 2014-09-16 NOTE — Progress Notes (Signed)
I have been asked to see the patient by Dr. Einar Crow, MD, for evaluation and management of elevated PSA.  History of present illness: 72M with history of extensive heart disease, Alzheimer's dz, and a brain aneurysm who presents today for an elevated PSA. PSA was drawn as part of the patient's routine annual blood work. The patient denies any progressive lower urinary tract symptoms including frequency, urgency, dysuria, or gross hematuria. However, the patient has had some progression in his urinary incontinence. Baseline, the patient does feel as if he empties his bladder completely. He denies urinary frequency. He only gets up one time at night. He does not have a history of urinary tract infections. The patient has no family history of prostate cancer. The patient denies any new bone pain or constitutional symptoms.  The patient's PSA results are not available  The majority of this interview was conducted with the help of his wife, the patient is unable to recall or remember most facts   IPS S: 20 Review of systems: A 12 point comprehensive review of systems was obtained and is negative unless otherwise stated in the history of present illness.  Patient Active Problem List   Diagnosis Date Noted  . 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 Depression, major, recurrent, in partial remission 01/22/2014  . Chronic systolic heart failure 12/20/2013  . Hypercholesterolemia without hypertriglyceridemia 12/20/2013  . Fatigue 08/09/2012  . Brain aneurysm 08/09/2012  . HYPERLIPIDEMIA-MIXED 10/16/2008  . ATRIAL FIBRILLATION, CHRONIC 10/16/2008  . TRANSIENT ISCHEMIC ATTACKS, HX OF 10/16/2008    Current Outpatient Prescriptions on File Prior to Visit  Medication Sig Dispense Refill  . aspirin EC 81 MG tablet Take 1 tablet (81 mg total) by mouth daily. 90 tablet 3  . buPROPion (WELLBUTRIN SR) 150 MG 12 hr tablet Take 150 mg by mouth 2 (two) times daily.    . CRESTOR 5  MG tablet TAKE 1 TABLET BY MOUTH EVERY DAY 30 tablet 6  . donepezil (ARICEPT) 5 MG tablet Take 5 mg by mouth at bedtime.    . furosemide (LASIX) 20 MG tablet TAKE 1 TABLET BY MOUTH EVERY OTHER DAY 15 tablet 4  . memantine (NAMENDA) 10 MG tablet Take 10 mg by mouth 2 (two) times daily.    . Multiple Vitamin (MULTIVITAMIN) capsule Take 1 capsule by mouth daily.      . nadolol (CORGARD) 40 MG tablet Take 1 tablet (40 mg total) by mouth daily. 30 tablet 0  . propranolol ER (INDERAL LA) 80 MG 24 hr capsule Take 1 capsule (80 mg total) by mouth daily. 30 capsule 0  . TURMERIC PO Take by mouth.    . vitamin C (ASCORBIC ACID) 500 MG tablet Take 500 mg by mouth daily.       No current facility-administered medications on file prior to visit.    Past Medical History  Diagnosis Date  . Hyperlipidemia   . Brain aneurysm   . TIA (transient ischemic attack)   . Coronary artery disease     a. nonobstructive CAD by cath in the past;  b.  Lexiscan Myoview (10/2011): No scar or ischemia, EF 75% (normal study).  . Atrial fibrillation     not on coumadin due to hx of intracranial bleed  . Hx of echocardiogram     a. Echo 10/04/11: mild LVH, EF 55-60%, mild MR, mod to severe LAE, mod RAE, PASP 23.   Marland Kitchen Hypertension     Past Surgical History  Procedure Laterality Date  . Appendectomy      History  Substance Use Topics  . Smoking status: Former Smoker    Quit date: 09/26/1996  . Smokeless tobacco: Former Neurosurgeon    Quit date: 09/26/1996  . Alcohol Use: No    Family History  Problem Relation Age of Onset  . Heart disease Father   . Hypertension Father   . Heart failure Father   . Arthritis Mother     PE: There were no vitals filed for this visit. Patient appears to be in no acute distress  patient is alert and oriented x3 Atraumatic normocephalic head No cervical or supraclavicular lymphadenopathy appreciated No increased work of breathing, no audible wheezes/rhonchi Regular sinus  rhythm/rate Abdomen is soft, nontender, nondistended, no CVA or suprapubic tenderness Patient's digital rectal exam reveals a prostate that is normal in size, symmetric, no nodularity Lower extremities are symmetric without appreciable edema Grossly neurologically intact No identifiable skin lesions  No results for input(s): WBC, HGB, HCT in the last 72 hours. No results for input(s): NA, K, CL, CO2, GLUCOSE, BUN, CREATININE, CALCIUM in the last 72 hours. No results for input(s): LABPT, INR in the last 72 hours. No results for input(s): LABURIN in the last 72 hours. Results for orders placed or performed during the hospital encounter of 03/10/11  Blood culture (routine x 2)     Status: None   Collection Time: 03/10/11  6:00 PM  Result Value Ref Range Status   Specimen Description BLOOD ARM RIGHT  Final   Special Requests BOTTLES DRAWN AEROBIC AND ANAEROBIC B10CC,R5CC  Final   Culture  Setup Time [id number]  Final   Culture NO GROWTH 5 DAYS  Final   Report Status 03/17/2011 FINAL  Final  Blood culture (routine x 2)     Status: None   Collection Time: 03/10/11  6:15 PM  Result Value Ref Range Status   Specimen Description BLOOD HAND RIGHT  Final   Special Requests BOTTLES DRAWN AEROBIC ONLY 10CC  Final   Culture  Setup Time [id number]  Final   Culture NO GROWTH 5 DAYS  Final   Report Status 03/17/2011 FINAL  Final  Urine culture     Status: None   Collection Time: 03/10/11  7:48 PM  Result Value Ref Range Status   Specimen Description URINE, CLEAN CATCH  Final   Special Requests NONE  Final   Culture  Setup Time [id number]  Final   Colony Count 10,000 COLONIES/ML  Final   Culture   Final    Multiple bacterial morphotypes present, none predominant. Suggest appropriate recollection if clinically indicated.   Report Status 03/12/2011 FINAL  Final   Postvoid residual: 68 mL's  Imaging: none  Imp: The patient was referred for an elevated PSA, no lab work is available at  this time. However, he does have severe lower urinary tract symptoms in the form of incomplete bladder emptying, urgency, and weak stream. Further, the patient has significant comorbidities including Alzheimer's disease, cardiac disease, and a history of TIA/brain aneurysm.  Recommendations: I recommended that we obtain the patient's outside records to include a PSA. Further, we will check a PSA today for comparison. I also recommended that we initiate the patient on Flomax 0.4 mg daily to improve his lower urinary tract symptoms. I went over the significance of an elevated PSA with the patient and his wife. Given the patient's comorbidities, unless his PSA is severely elevated, I told think pursuing a diagnosis of prostate  cancer is prudent. I think it is much more likely that he died from something else within his past metal history rather than low-grade prostate cancer. However, I will follow the patient one month for further discussion.   Berniece SalinesHERRICK, Ferrah Panagopoulos W

## 2014-09-16 NOTE — Addendum Note (Signed)
Addended by: Mervin KungWILLIAMS, RAMONA K on: 09/16/2014 11:59 AM   Modules accepted: Orders

## 2014-09-18 LAB — PSA TOTAL (REFLEX TO FREE)
% FREE PSA: 18.2 %
PSA, FREE: 0.91 ng/mL

## 2014-10-14 ENCOUNTER — Ambulatory Visit (INDEPENDENT_AMBULATORY_CARE_PROVIDER_SITE_OTHER): Payer: Medicare Other | Admitting: Urology

## 2014-10-14 VITALS — BP 114/72 | HR 89 | Ht 70.0 in | Wt 182.8 lb

## 2014-10-14 DIAGNOSIS — R350 Frequency of micturition: Secondary | ICD-10-CM | POA: Diagnosis not present

## 2014-10-14 LAB — BLADDER SCAN AMB NON-IMAGING: Scan Result: 0

## 2014-10-14 NOTE — Patient Instructions (Signed)
Recommend that the patient discontinue his tamsulosin. Recommend the patient perform timed voiding, every 2 hours. Recommend that the patient double void prior to bedtime and in the morning. Recommend that the patient treat his constipation with Mira lax as instructed. The simple pain here modifications should improve the patient's voiding symptoms.

## 2014-10-14 NOTE — Progress Notes (Signed)
I have been asked to see the patient by Dr. Einar Crow, MD, for evaluation and management of elevated PSA.  History of present illness: 40M with history of extensive heart disease, Alzheimer's dz, and a brain aneurysm who presents today for an elevated PSA. PSA was drawn as part of the patient's routine annual blood work. The patient denies any progressive lower urinary tract symptoms including frequency, urgency, dysuria, or gross hematuria. However, the patient has had some progression in his urinary incontinence. Baseline, the patient does feel as if he empties his bladder completely. He denies urinary frequency. He only gets up one time at night. He does not have a history of urinary tract infections. The patient has no family history of prostate cancer. The patient denies any new bone pain or constitutional symptoms.  The majority of this interview was conducted with the help of his wife, the patient is unable to recall or remember most facts   IPSS: 20 PVR - 65mL  PSA history -  1.48 on 08/2013 16.4 on 08/2014  4.78, (.91 Free, 19% free) on 09/2014   DRE - normal.  Intv: started on Flomax for voiding symptoms.  The patient's voiding symptoms have not significantly improved. He continues to have urge incontinence. He also continues to have a weak stream. This is despite taking Flomax for 1 month. The patient's repeat PSA was 4.7, significantly reduced from 16 1 month prior.   Review of systems: A 12 point comprehensive review of systems was obtained and is negative unless otherwise stated in the history of present illness.  Patient Active Problem List   Diagnosis Date Noted  . 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 Depression, major, recurrent, in partial remission 01/22/2014  . Chronic systolic heart failure 12/20/2013  . Hypercholesterolemia without hypertriglyceridemia 12/20/2013  . Fatigue 08/09/2012  . Brain aneurysm 08/09/2012  .  HYPERLIPIDEMIA-MIXED 10/16/2008  . ATRIAL FIBRILLATION, CHRONIC 10/16/2008  . TRANSIENT ISCHEMIC ATTACKS, HX OF 10/16/2008    Current Outpatient Prescriptions on File Prior to Visit  Medication Sig Dispense Refill  . aspirin EC 81 MG tablet Take 1 tablet (81 mg total) by mouth daily. 90 tablet 3  . buPROPion (WELLBUTRIN SR) 150 MG 12 hr tablet Take 150 mg by mouth 2 (two) times daily.    . CRESTOR 5 MG tablet TAKE 1 TABLET BY MOUTH EVERY DAY 30 tablet 6  . donepezil (ARICEPT) 5 MG tablet Take 5 mg by mouth at bedtime.    . furosemide (LASIX) 20 MG tablet TAKE 1 TABLET BY MOUTH EVERY OTHER DAY 15 tablet 4  . memantine (NAMENDA) 10 MG tablet Take 10 mg by mouth 2 (two) times daily.    . metoprolol tartrate (LOPRESSOR) 25 MG tablet Take 25 mg by mouth 2 (two) times daily.  11  . Multiple Vitamin (MULTIVITAMIN) capsule Take 1 capsule by mouth daily.      . tamsulosin (FLOMAX) 0.4 MG CAPS capsule Take 1 capsule (0.4 mg total) by mouth daily. 30 capsule 5  . TURMERIC PO Take by mouth.    . vitamin C (ASCORBIC ACID) 500 MG tablet Take 500 mg by mouth daily.       No current facility-administered medications on file prior to visit.    Past Medical History  Diagnosis Date  . Hyperlipidemia   . Brain aneurysm   . TIA (transient ischemic attack)   . Coronary artery disease     a. nonobstructive CAD by cath in the past;  b.  Lexiscan Myoview (10/2011): No scar or ischemia, EF 75% (normal study).  . Atrial fibrillation     not on coumadin due to hx of intracranial bleed  . Hx of echocardiogram     a. Echo 10/04/11: mild LVH, EF 55-60%, mild MR, mod to severe LAE, mod RAE, PASP 23.   Marland Kitchen Hypertension     Past Surgical History  Procedure Laterality Date  . Appendectomy      History  Substance Use Topics  . Smoking status: Former Smoker    Quit date: 09/26/1996  . Smokeless tobacco: Former Neurosurgeon    Quit date: 09/26/1996  . Alcohol Use: No    Family History  Problem Relation Age of Onset   . Heart disease Father   . Hypertension Father   . Heart failure Father   . Arthritis Mother     PE: There were no vitals filed for this visit. Patient appears to be in no acute distress  patient is alert but unable to really participate or engage in conversation.  No results for input(s): WBC, HGB, HCT in the last 72 hours. No results for input(s): NA, K, CL, CO2, GLUCOSE, BUN, CREATININE, CALCIUM in the last 72 hours. No results for input(s): LABPT, INR in the last 72 hours. No results for input(s): LABURIN in the last 72 hours. Results for orders placed or performed in visit on 09/16/14  Microscopic Examination     Status: None   Collection Time: 09/16/14 11:05 AM  Result Value Ref Range Status   WBC, UA 0-5 0 -  5 /hpf Final   RBC, UA 0-2 0 -  2 /hpf Final   Epithelial Cells (non renal) 0-10 0 - 10 /hpf Final   Bacteria, UA None seen None seen/Few Final    Imaging: none  Assessment/recommendations: The patient's voiding symptoms have not really responded well to Flomax. Given his weak stream I'm reluctant to start the patient on anticholinergic or myrbetriq inferior putting him into urinary retention. I did recommend that he and his wife work on some behavior modification such as timed voiding every 2 hours when awake, and double voiding at least once prior to bedtime and again in the morning. Further, I think the patient's constipation needs to be aggressively managed as well, this could be having any implications for his urinary tract symptoms. As relates to the patient's PSA doesn't proving it is trending back to normal. The patient's associated comorbidities I don't think it is worth repeating her checking again. I reassured the patient and his wife that he was almost certainly not likely to die from prostate cancer based on his rectal exam, his PSA from 1 year prior, and his PSA which is today returning back to normal.  I have recommended that the patient follow-up with [place]  on an as-needed basis.    Berniece Salines W

## 2014-11-02 ENCOUNTER — Other Ambulatory Visit: Payer: Self-pay | Admitting: Internal Medicine

## 2015-03-30 IMAGING — US ULTRASOUND AORTA
1 series · 14 of 25 positions shown · non-contrast
Comparison: none

REASON FOR EXAM: AAA screening welcome to [employer] [facility] NP
[address] [HOSPITAL]...
COMMENTS:

[Series 1: ultrasound aorta · 0.31mm/px · 14 of 28 slices shown]
[im 1/28]
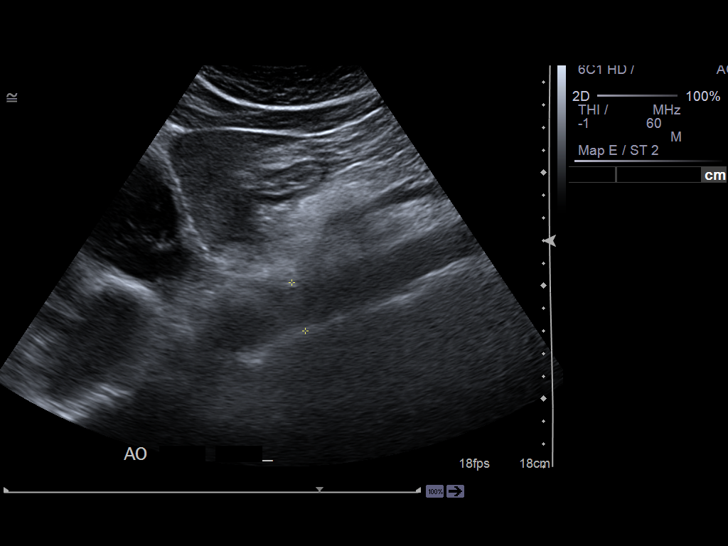
[im 3/28]
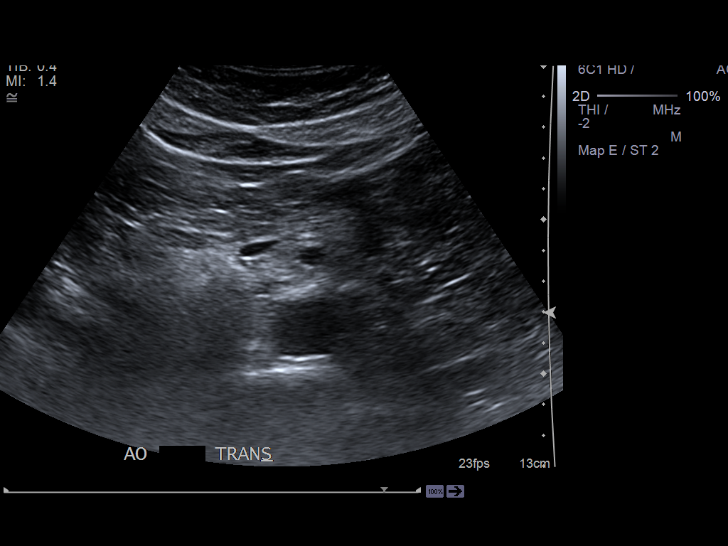
[im 5/28]
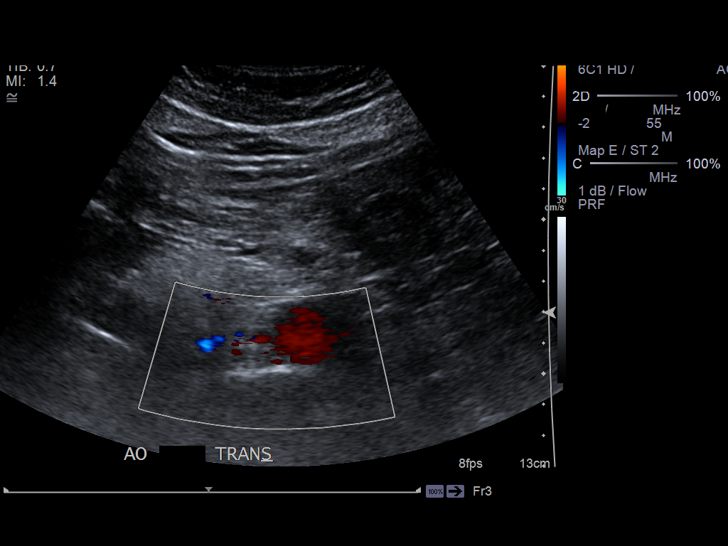
[im 7/28]
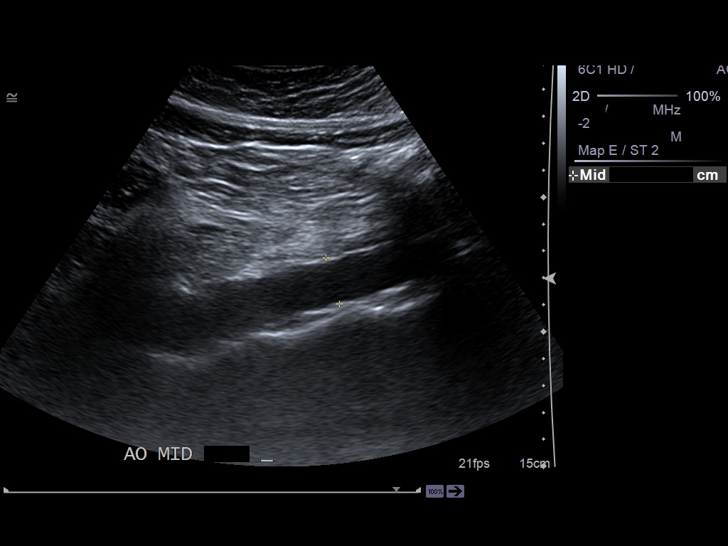
[im 10/28]
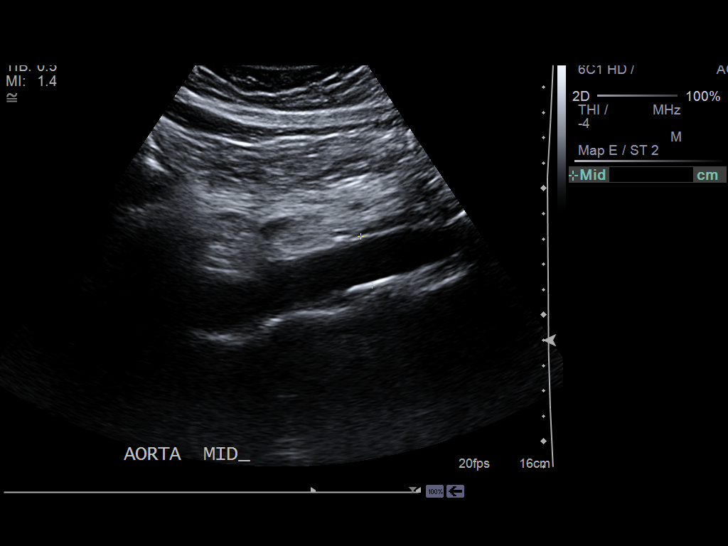
[im 11/28]
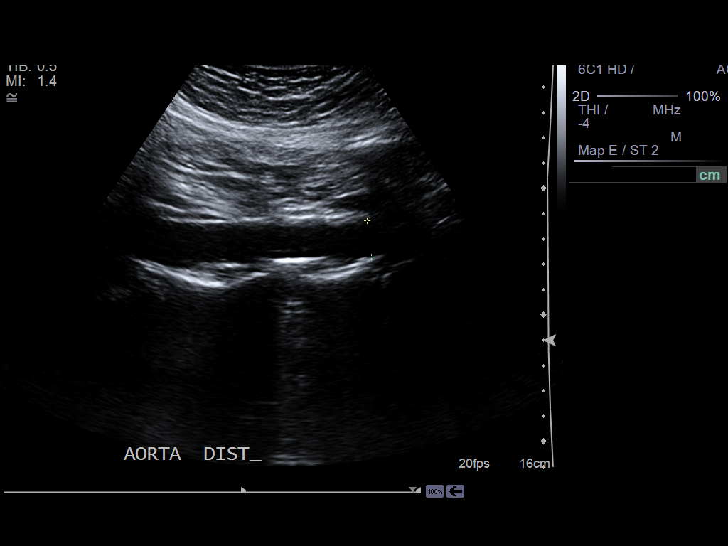
[im 13/28]
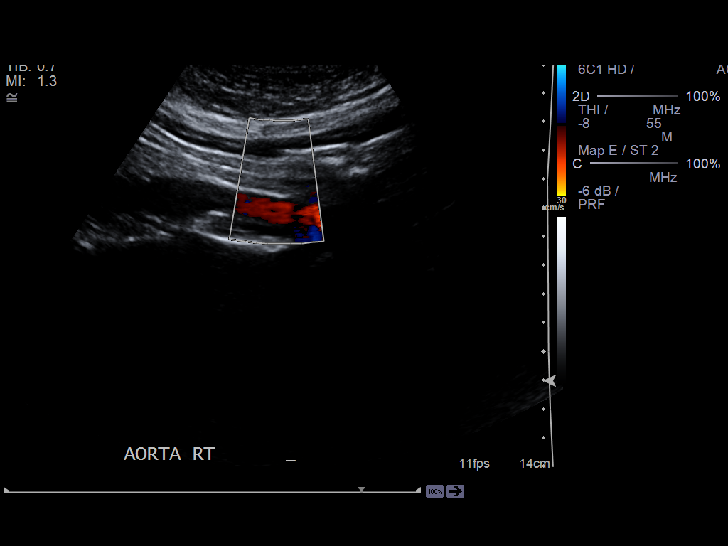
[im 15/28]
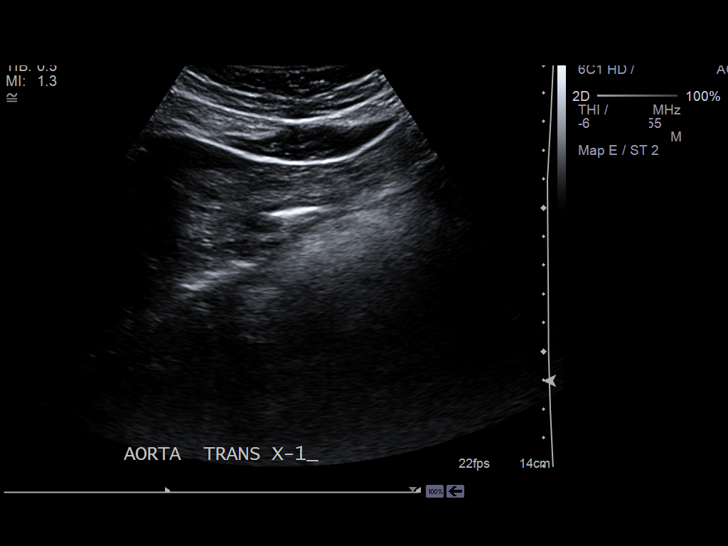
[im 17/28]
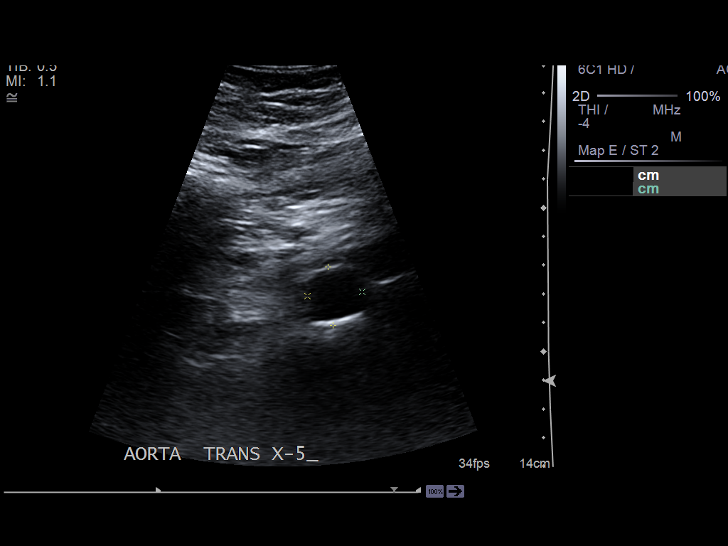
[im 19/28]
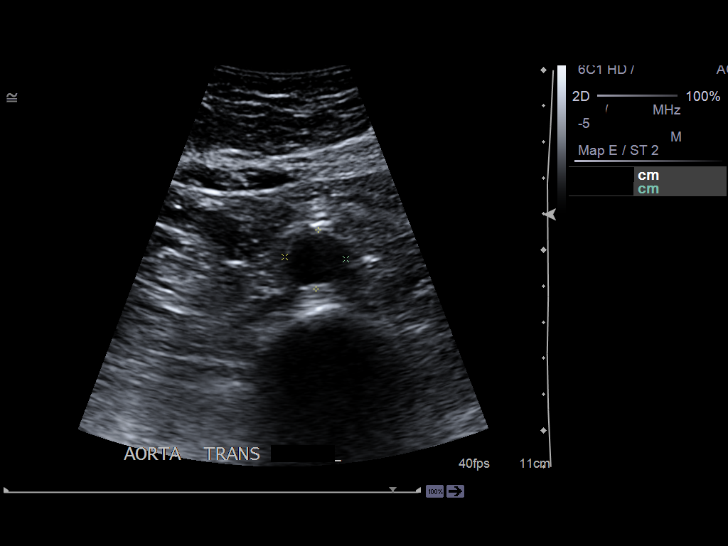
[im 21/28]
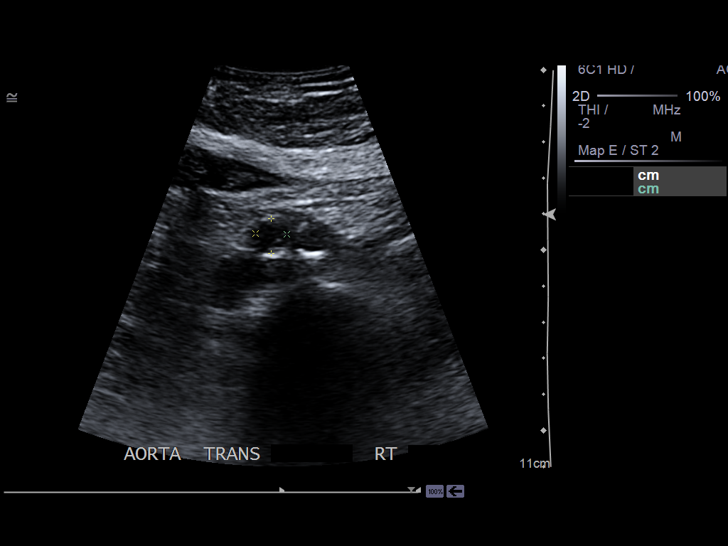
[im 23/28]
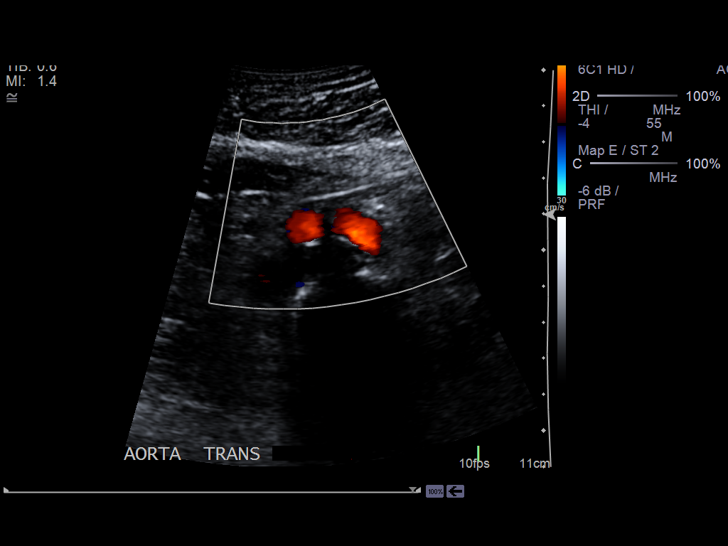
[im 25/28]
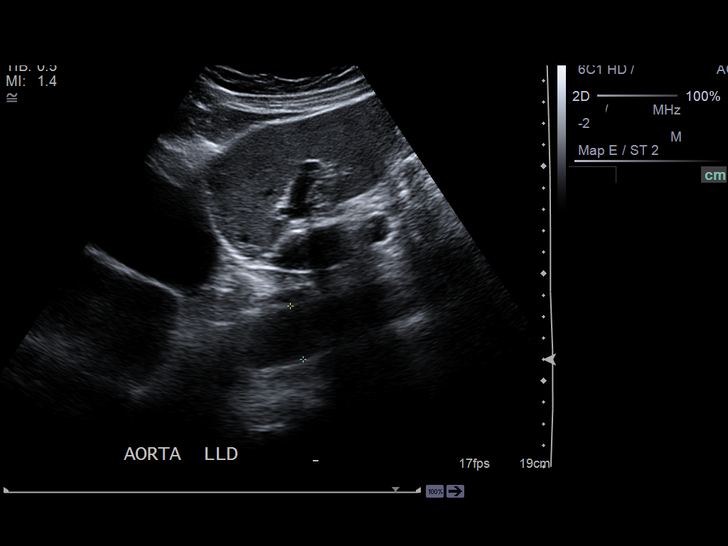
[im 28/28]
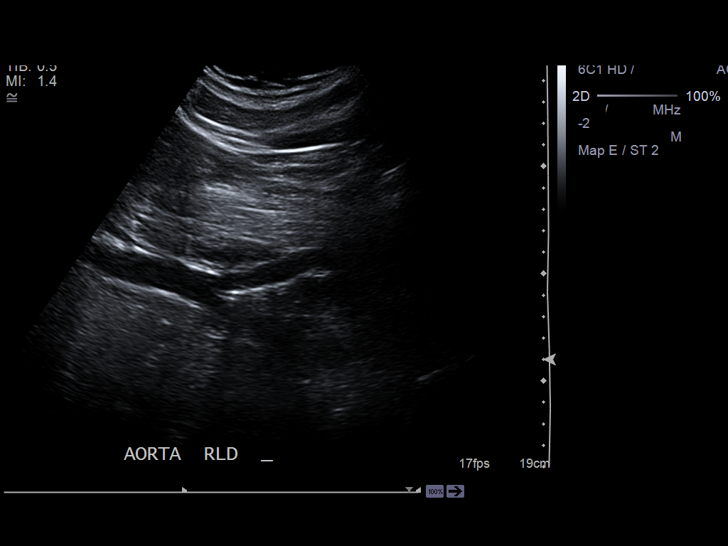

[14 of 25 positions shown; findings below may reference images not displayed]

PROCEDURE:     US  - US AORTA  - August 23, 2012  [DATE]

RESULT:     Ultrasound of the abdominal aorta is performed. AP and
transverse dimensions are given. The proximal aorta measures 2.18 x 1.96 cm.
The mid abdominal aorta measures 1.91 x 1.95 cm while the distal abdominal
aorta is 1.64 x 1.70 cm. The iliac arteries are normal. There is no
significant stenosis appreciated.
IMPRESSION: No evidence of abdominal aortic aneurysm.

[REDACTED]

## 2015-05-28 ENCOUNTER — Other Ambulatory Visit: Payer: Self-pay | Admitting: Internal Medicine

## 2015-05-28 NOTE — Telephone Encounter (Signed)
REFILL 

## 2015-06-08 ENCOUNTER — Other Ambulatory Visit: Payer: Self-pay | Admitting: Internal Medicine

## 2015-08-11 ENCOUNTER — Encounter: Payer: Self-pay | Admitting: Internal Medicine

## 2015-08-11 ENCOUNTER — Ambulatory Visit (INDEPENDENT_AMBULATORY_CARE_PROVIDER_SITE_OTHER): Payer: Medicare Other | Admitting: Internal Medicine

## 2015-08-11 VITALS — BP 134/70 | HR 94 | Ht 70.0 in | Wt 184.8 lb

## 2015-08-11 DIAGNOSIS — I4821 Permanent atrial fibrillation: Secondary | ICD-10-CM

## 2015-08-11 DIAGNOSIS — I482 Chronic atrial fibrillation: Secondary | ICD-10-CM

## 2015-08-11 MED ORDER — METOPROLOL TARTRATE 25 MG PO TABS: 25.0000 mg | ORAL_TABLET | Freq: Every day | ORAL | Status: AC

## 2015-08-11 MED ORDER — FUROSEMIDE 20 MG PO TABS: 20.0000 mg | ORAL_TABLET | Freq: Every day | ORAL | Status: AC | PRN

## 2015-08-11 NOTE — Progress Notes (Signed)
Patient Care Team: Lauro RegulusMarshall W Anderson, MD as PCP - General (Internal Medicine)   HPI  Ernest [name] is a 75 y.o. male Seen in followup for atrial fibrillation which is permanent associated with some degree of rapid ventricular rates and exercise intolerance. At his last visit, December 14, he was given a prescription for multiple beta blockers to see if we can identify one that would be helpful.  He also has a history of a prior brain aneurysm and was on antiplatelet therapy; he was to follow up with neurosurgery regarding the use of apixaban  they thought they anticoagulation would not be appropriate.  He continues to get increasingly confused and lethargy remains an issue. His wife is concerned about the contributions of various medications.  This continues to be the case.  Echocardiogram had demonstrated normal left ventricular function left atrial enlargement and diastolic dysfunction with left ventricular hypertrophy    Past Medical History  Diagnosis Date  . Hyperlipidemia   . Brain aneurysm   . TIA (transient ischemic attack)   . Coronary artery disease     a. nonobstructive CAD by cath in the past;  b.  Lexiscan Myoview (10/2011): No scar or ischemia, EF 75% (normal study).  . Atrial fibrillation (HCC)     not on coumadin due to hx of intracranial bleed  . Hx of echocardiogram     a. Echo 10/04/11: mild LVH, EF 55-60%, mild MR, mod to severe LAE, mod RAE, PASP 23.   Marland Kitchen. Hypertension     Past Surgical History  Procedure Laterality Date  . Appendectomy      Current Outpatient Prescriptions  Medication Sig Dispense Refill  . aspirin 81 MG tablet Take 162 mg by mouth daily.    Marland Kitchen. buPROPion (WELLBUTRIN SR) 150 MG 12 hr tablet Take 150 mg by mouth 2 (two) times daily.    Marland Kitchen. donepezil (ARICEPT) 5 MG tablet Take 5 mg by mouth at bedtime.    . furosemide (LASIX) 20 MG tablet Take 20 mg by mouth daily as needed for fluid or edema.    . memantine (NAMENDA) 10 MG tablet Take  10 mg by mouth 2 (two) times daily.    . metoprolol tartrate (LOPRESSOR) 25 MG tablet Take 25 mg by mouth daily.    . rosuvastatin (CRESTOR) 5 MG tablet TAKE 1 TABLET BY MOUTH EVERY DAY 30 tablet 0  . tamsulosin (FLOMAX) 0.4 MG CAPS capsule Take 1 capsule (0.4 mg total) by mouth daily. 30 capsule 5  . vitamin C (ASCORBIC ACID) 500 MG tablet Take 500 mg by mouth daily.       No current facility-administered medications for this visit.    Allergies  Allergen Reactions  . Atorvastatin Other (See Comments)    REACTION: Muscle cramps  . Simvastatin Other (See Comments)    REACTION: Muscle cramps    Review of Systems negative except from HPI and PMH  Physical Exam BP 134/70 mmHg  Pulse 94  Ht 5\' 10"  (1.778 m)  Wt 184 lb 12.8 oz (83.825 kg)  BMI 26.52 kg/m2 Well developed and well nourished in no acute distress HENT normal E scleral and icterus clear Neck Supple   carotids brisk and full Clear to ausculation Irregularly irregular rate and rhythm, no murmurs gallops or rub Soft with active bowel sounds No clubbing cyanosis  Edema Alert  but not oriented grossly normal motor and sensory function Skin Warm and Dry  ECG demonstrates atrial fibrillation at  72 intervals-/09/39  Assessment and  Plan  Atrial fibrillation-permanent  Intracranial aneurysm-precluding anticoagulation  Fatigue  Confusion   the issue is fatigue. I suggested that he follow-up with Dr. Dareen Piano to consider whether there has been intercurrent stroke as this was a manifestation in my office and with him being on anticoagulated atrial fibrillation his risks for this are high.  He is taking metoprolol tartrate daily???

## 2015-08-11 NOTE — Patient Instructions (Signed)

## 2015-08-17 IMAGING — CR DG CHEST 2V
3 series · 3 of 3 positions shown · non-contrast
Comparison: 10/03/2011

CLINICAL DATA: Dyspnea on exertion.

EXAM:
CHEST  2 VIEW

[view not recorded (1 of 3)]
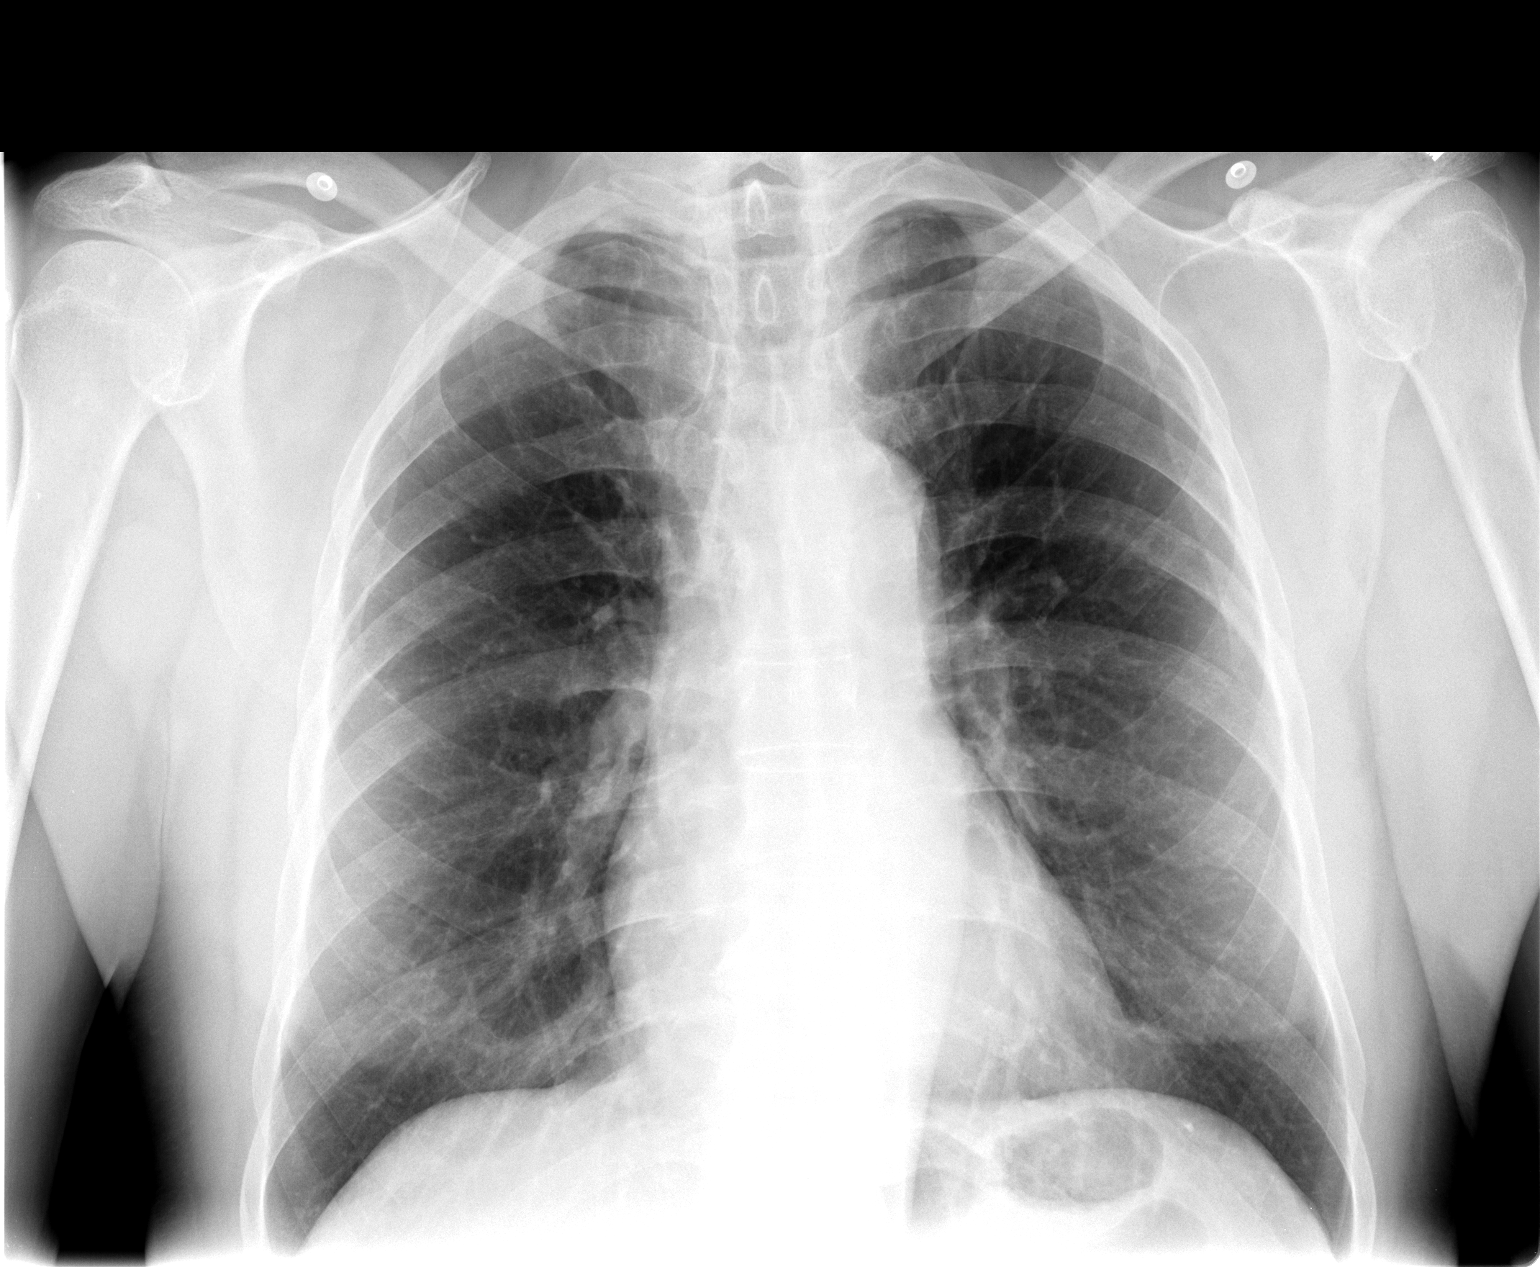

[view not recorded (2 of 3)]
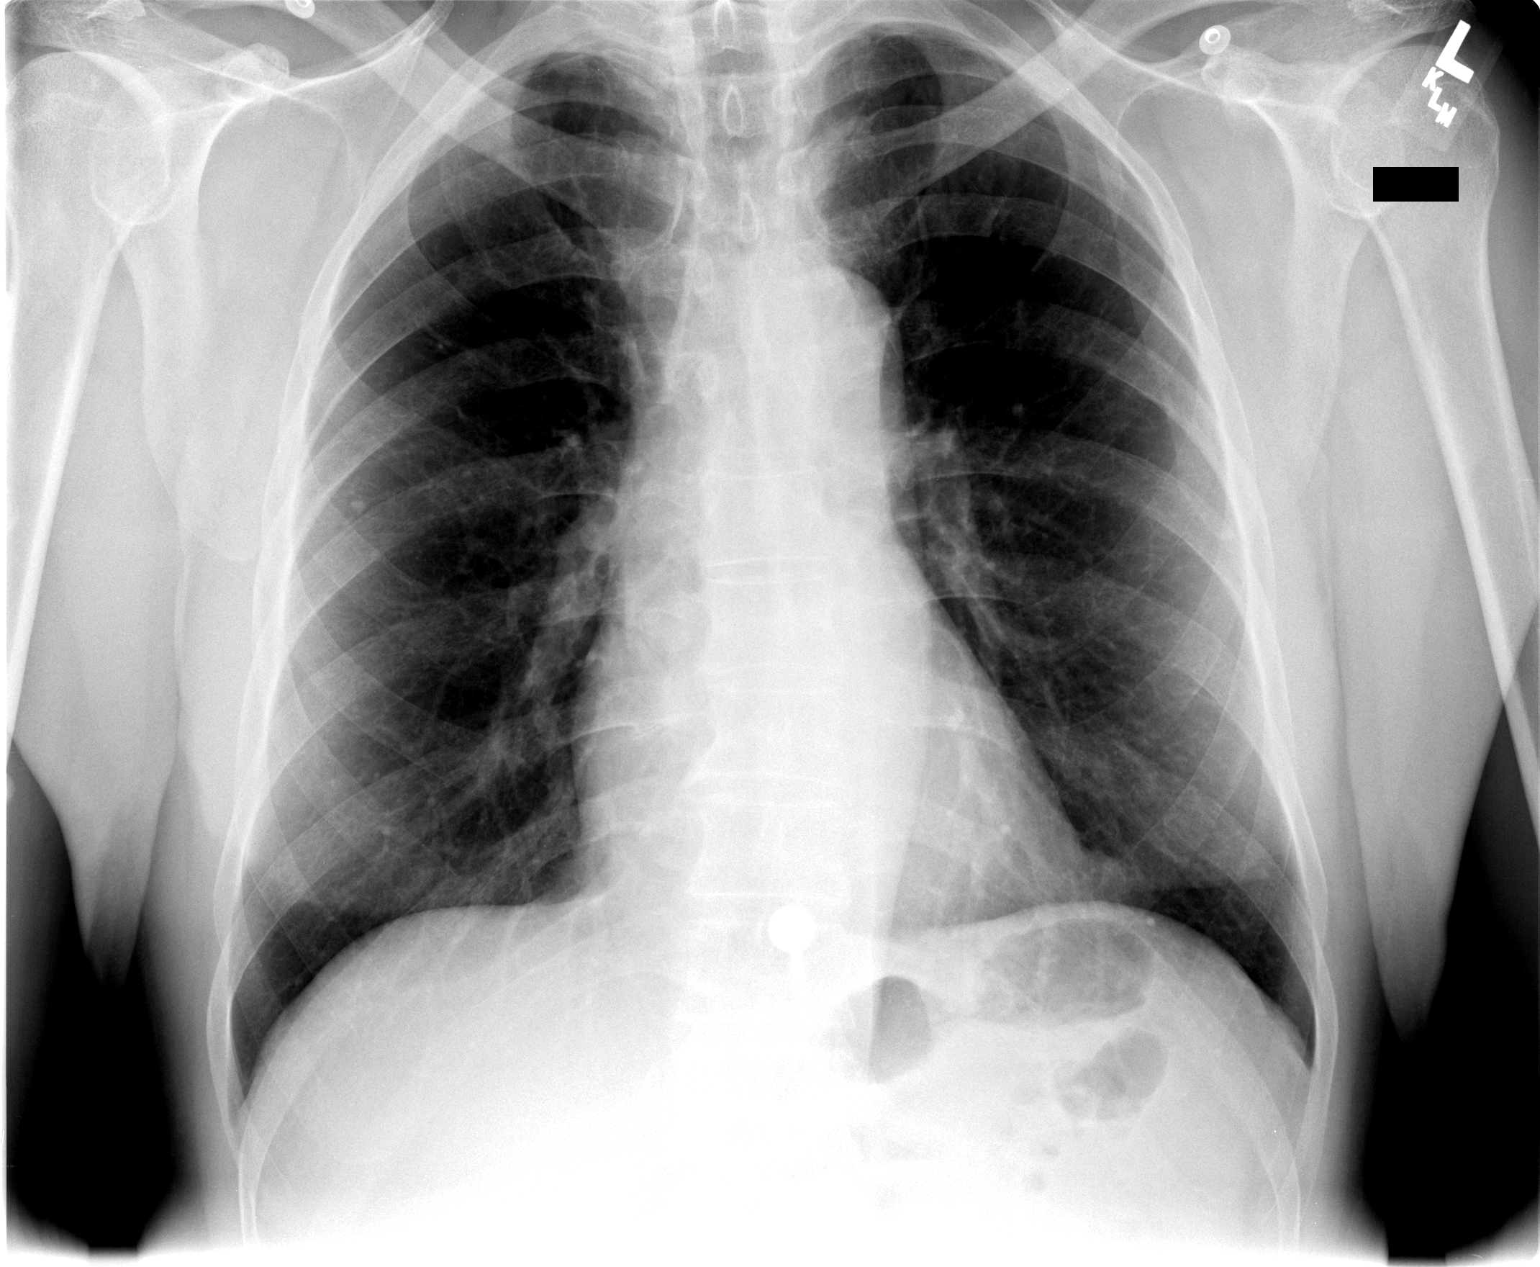

[view not recorded (3 of 3)]
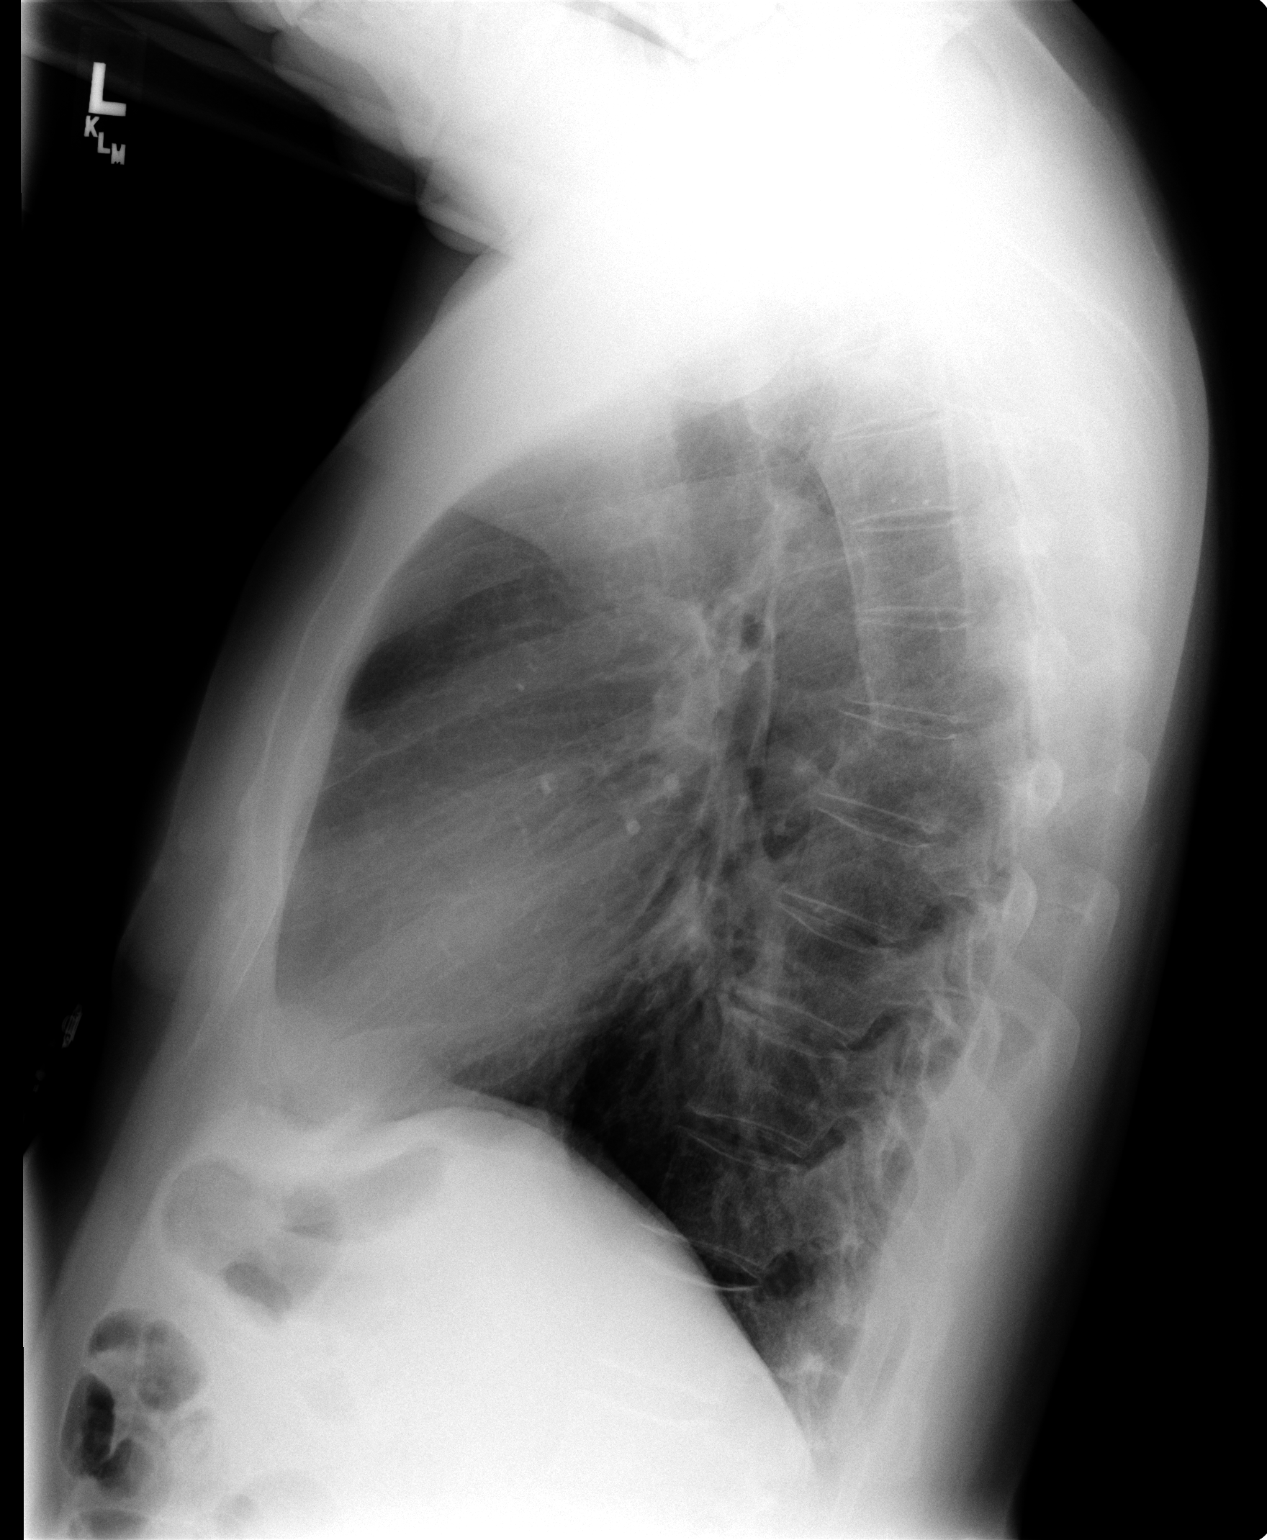

[3 of 3 positions shown; findings below may reference images not displayed]

FINDINGS: The heart size and mediastinal contours are within normal limits.
Minimal stable scarring at the apices. The lungs are otherwise
clear. No pleural effusion or pneumothorax. The visualized skeletal
structures are unremarkable.
IMPRESSION: No active cardiopulmonary disease.

## 2015-08-22 ENCOUNTER — Other Ambulatory Visit: Payer: Self-pay | Admitting: Internal Medicine

## 2015-08-24 NOTE — Telephone Encounter (Signed)
Please advise on request as there is not a recent lipid panel in epic. Thanks, MI 

## 2015-08-25 NOTE — Telephone Encounter (Signed)
Spoke with patient and his wife and the wife stated that Dr Dareen PianoAnderson does manage the patients lipids. She will call their office to request the refill.

## 2015-08-25 NOTE — Telephone Encounter (Signed)
Please try to contact the patient to see if Dr. Dareen PianoAnderson has been following his lipids/ call Dr. Ewell PoeAnderson's office and if they have been following this, please forward RX to him.  Thanks!

## 2016-02-02 ENCOUNTER — Encounter: Payer: Self-pay | Admitting: Internal Medicine

## 2016-02-09 ENCOUNTER — Ambulatory Visit (INDEPENDENT_AMBULATORY_CARE_PROVIDER_SITE_OTHER): Payer: Medicare Other | Admitting: Internal Medicine

## 2016-02-09 ENCOUNTER — Encounter: Payer: Self-pay | Admitting: Internal Medicine

## 2016-02-09 VITALS — BP 136/76 | HR 79 | Ht 68.0 in | Wt 186.8 lb

## 2016-02-09 DIAGNOSIS — I482 Chronic atrial fibrillation: Secondary | ICD-10-CM

## 2016-02-09 DIAGNOSIS — I4821 Permanent atrial fibrillation: Secondary | ICD-10-CM

## 2016-02-09 NOTE — Patient Instructions (Addendum)
Medication Instructions: - Your physician recommends that you continue on your current medications as directed. Please refer to the Current Medication list given to you today.  Labwork: - none ordered  Procedures/Testing: - none ordered  Follow-Up: - Your physician wants you to follow-up in: 1 year [facility].  You will receive a reminder letter in the mail two months in advance. If you don't receive a letter, please call our office to schedule the follow-up appointment.  Any Additional Special Instructions Will Be Listed Below (If Applicable).     If you need a refill on your cardiac medications before your next appointment, please call your pharmacy.

## 2016-02-09 NOTE — Progress Notes (Signed)
(Ph     Patient Care Team: Lauro RegulusMarshall W Anderson, MD as PCP - General (Internal Medicine)   HPI  Ernest Merinoecil L [name] is a 75 y.o. male Seen in followup for atrial fibrillation which is permanent associated with some degree of rapid ventricular rates and exercise intolerance.    He also has a history of a prior brain aneurysm and was on antiplatelet therapy; he was to follow up with neurosurgery regarding the use of apixaban.  They thought they anticoagulation would not be appropriate.  Memory continues to be an issue. Exercise is not an big part of his day and we will first hole  Echocardiogram had demonstrated normal left ventricular function left atrial enlargement and diastolic dysfunction with left ventricular hypertrophy  Notes from Dr. Dareen PianoAnderson were reviewed.   4/17 Cr 1.4 K4.0      Past Medical History:  Diagnosis Date  . Atrial fibrillation (HCC)    not on coumadin due to hx of intracranial bleed  . Brain aneurysm   . Coronary artery disease    a. nonobstructive CAD by cath in the past;  b.  Lexiscan Myoview (10/2011): No scar or ischemia, EF 75% (normal study).  Marland Kitchen. Hx of echocardiogram    a. Echo 10/04/11: mild LVH, EF 55-60%, mild MR, mod to severe LAE, mod RAE, PASP 23.   Marland Kitchen. Hyperlipidemia   . Hypertension   . TIA (transient ischemic attack)     Past Surgical History:  Procedure Laterality Date  . APPENDECTOMY      Current Outpatient Prescriptions  Medication Sig Dispense Refill  . aspirin 81 MG tablet Take 162 mg by mouth daily.    Marland Kitchen. buPROPion (WELLBUTRIN SR) 150 MG 12 hr tablet Take 150 mg by mouth 2 (two) times daily.    Marland Kitchen. donepezil (ARICEPT) 5 MG tablet Take 5 mg by mouth at bedtime.    . furosemide (LASIX) 20 MG tablet Take 1 tablet (20 mg total) by mouth daily as needed for fluid or edema. 90 tablet 3  . memantine (NAMENDA) 10 MG tablet Take 10 mg by mouth 2 (two) times daily.    . metoprolol tartrate (LOPRESSOR) 25 MG tablet Take 1 tablet (25 mg total) by mouth  daily. 90 tablet 3  . rosuvastatin (CRESTOR) 5 MG tablet TAKE 1 TABLET BY MOUTH EVERY DAY 30 tablet 0  . vitamin C (ASCORBIC ACID) 500 MG tablet Take 500 mg by mouth daily.       No current facility-administered medications for this visit.     Allergies  Allergen Reactions  . Atorvastatin Other (See Comments)    REACTION: Muscle cramps  . Simvastatin Other (See Comments)    REACTION: Muscle cramps    Review of Systems negative except from HPI and PMH  Physical Exam BP 136/76   Pulse 79   Ht 5\' 8"  (1.727 m)   Wt 186 lb 12.8 oz (84.7 kg)   SpO2 92%   BMI 28.40 kg/m  Well developed and well nourished in no acute distress HENT normal E scleral and icterus clear Neck Supple Clear to ausculation Irregularly irregular rate and rhythm, no murmurs gallops or rub Soft with active bowel sounds No clubbing cyanosis  Edema Alert  but not oriented grossly normal motor and sensory function Skin Warm and Dry  ECG demonstrates atrial fibrillation at 88  intervals-/09/38  Assessment and  Plan  Atrial fibrillation-permanent  Intracranial aneurysm-precluding anticoagulation  Dementia   Continue current meds  Euvolemic   follwoup with  Dr Dareen PianoAnderson  On Jonne PlyAsa ;  No bleeding issues

## 2016-05-24 ENCOUNTER — Encounter: Payer: Self-pay | Admitting: Urology

## 2016-05-24 ENCOUNTER — Ambulatory Visit (INDEPENDENT_AMBULATORY_CARE_PROVIDER_SITE_OTHER): Payer: Medicare Other | Admitting: Urology

## 2016-05-24 VITALS — BP 119/71 | HR 93 | Ht 68.0 in | Wt 192.3 lb

## 2016-05-24 DIAGNOSIS — R3912 Poor urinary stream: Secondary | ICD-10-CM | POA: Diagnosis not present

## 2016-05-24 DIAGNOSIS — R350 Frequency of micturition: Secondary | ICD-10-CM

## 2016-05-24 DIAGNOSIS — R3989 Other symptoms and signs involving the genitourinary system: Secondary | ICD-10-CM

## 2016-05-24 LAB — BLADDER SCAN AMB NON-IMAGING: Scan Result: 468

## 2016-05-24 MED ORDER — TAMSULOSIN HCL 0.4 MG PO CAPS
0.4000 mg | ORAL_CAPSULE | Freq: Every day | ORAL | 11 refills | Status: AC
Start: 2016-05-24 — End: ?

## 2016-05-24 NOTE — Progress Notes (Signed)
05/24/2016 1:12 PM   Ernest MerinoCecil L [name] 10/08/1940 [id number]  Referring provider: Lauro RegulusMarshall W Anderson, MD [address] [facility] [place], [place] [id number]  Chief Complaint  Patient presents with  . Urinary Frequency    Weak stream    HPI: F/u for LUTS -   1) BPH with LUTS - seen for weak stream, incontinence in 2016. On tamsulosin. IPSS was 20, pvr 65 ml.   NG risk includes Alzheimer's dz and a brain aneurysm.     2) elevated PSA -   PSA history -  1.48 on 08/2013 16.4 on 08/2014  4.78, (.91 Free, 19% free) on 09/2014 - DRE was normal.   The patient has no family history of prostate cancer. The patient denies any new bone pain or constitutional symptoms.  Today, patient is seen for the above. He was evaluated by his PCP recently when a UA showed no red blood cells, 0-3 white cells and moderate bacteria. Urine culture grew enterococcus and he was treated with antibiotics. He has a very weak stream. He has frequency, but he "can't be still for very long". He's "always up". He wears pads and they're changed 3-4 times per day. He sometimes voids in the pads.    He could not void for a specimen here. His bladder scan showed a 468 mL.  The majority of this interview was conducted with the help of his wife, the patient is unable to recall or remember most facts   PMH: Past Medical History:  Diagnosis Date  . Atrial fibrillation (HCC)    not on coumadin due to hx of intracranial bleed  . Brain aneurysm   . Coronary artery disease    a. nonobstructive CAD by cath in the past;  b.  Lexiscan Myoview (10/2011): No scar or ischemia, EF 75% (normal study).  Marland Kitchen. Hx of echocardiogram    a. Echo 10/04/11: mild LVH, EF 55-60%, mild MR, mod to severe LAE, mod RAE, PASP 23.   Marland Kitchen. Hyperlipidemia   . Hypertension   . TIA (transient ischemic attack)     Surgical History: Past Surgical History:  Procedure Laterality Date  . APPENDECTOMY      Home Medications:  Allergies  as of 05/24/2016      Reactions   Atorvastatin Other (See Comments)   REACTION: Muscle cramps   Simvastatin Other (See Comments)   REACTION: Muscle cramps      Medication List       Accurate as of 05/24/16  1:12 PM. Always use your most recent med list.          aspirin 81 MG tablet Take 162 mg by mouth daily.   buPROPion 150 MG 12 hr tablet Commonly known as:  WELLBUTRIN SR Take 150 mg by mouth 2 (two) times daily.   cholecalciferol 1000 units tablet Commonly known as:  VITAMIN D Take 1,000 Units by mouth daily.   donepezil 5 MG tablet Commonly known as:  ARICEPT Take 5 mg by mouth at bedtime.   furosemide 20 MG tablet Commonly known as:  LASIX Take 1 tablet (20 mg total) by mouth daily as needed for fluid or edema.   memantine 10 MG tablet Commonly known as:  NAMENDA Take 10 mg by mouth 2 (two) times daily.   metoprolol tartrate 25 MG tablet Commonly known as:  LOPRESSOR Take 1 tablet (25 mg total) by mouth daily.   rosuvastatin 5 MG tablet Commonly known as:  CRESTOR TAKE 1  TABLET BY MOUTH EVERY DAY   vitamin C 500 MG tablet Commonly known as:  ASCORBIC ACID Take 500 mg by mouth daily.       Allergies:  Allergies  Allergen Reactions  . Atorvastatin Other (See Comments)    REACTION: Muscle cramps  . Simvastatin Other (See Comments)    REACTION: Muscle cramps    Family History: Family History  Problem Relation Age of Onset  . Heart disease Father   . Hypertension Father   . Heart failure Father   . Arthritis Mother   . Kidney disease Neg Hx   . Prostate cancer Neg Hx     Social History:  reports that he quit smoking about 19 years ago. He quit smokeless tobacco use about 19 years ago. He reports that he does not drink alcohol or use drugs.  ROS: UROLOGY Frequent Urination?: No Hard to postpone urination?: No Burning/pain with urination?: No Get up at night to urinate?: Yes Leakage of urine?: Yes Urine stream starts and stops?:  No Trouble starting stream?: Yes Do you have to strain to urinate?: No Blood in urine?: No Urinary tract infection?: No Sexually transmitted disease?: No Injury to kidneys or bladder?: No Painful intercourse?: No Weak stream?: No Erection problems?: No Penile pain?: No  Gastrointestinal Nausea?: No Vomiting?: No Indigestion/heartburn?: No Diarrhea?: No Constipation?: No  Constitutional Fever: No Night sweats?: No Weight loss?: No Fatigue?: No  Skin Skin rash/lesions?: No Itching?: No  Eyes Blurred vision?: No Double vision?: No  Ears/Nose/Throat Sore throat?: No Sinus problems?: No  Hematologic/Lymphatic Swollen glands?: No Easy bruising?: No  Cardiovascular Leg swelling?: No Chest pain?: No  Respiratory Cough?: No Shortness of breath?: No  Endocrine Excessive thirst?: No  Musculoskeletal Back pain?: No Joint pain?: No  Neurological Headaches?: No Dizziness?: No  Psychologic Depression?: No Anxiety?: No  Physical Exam: BP 119/71 (BP Location: Left Arm, Patient Position: Sitting, Cuff Size: Normal)   Pulse 93   Ht 5\' 8"  (1.727 m)   Wt 87.2 kg (192 lb 4.8 oz)   BMI 29.24 kg/m   Constitutional:  Alert and oriented, No acute distress. HEENT: [place] AT, moist mucus membranes.  Trachea midline, no masses. Cardiovascular: No clubbing, cyanosis, or edema. Respiratory: Normal respiratory effort, no increased work of breathing. GI: Abdomen is soft, nontender, nondistended, no abdominal masses GU: No CVA tenderness.  Penis: uncircumcised, no mass Scrotum: testicles descended bilaterally  DRE: difficult exam, pt can't sit still, hard stool in vault, prostate hard and flat almost like it's been radiated.  Skin: No rashes, bruises or suspicious lesions. Lymph: No cervical or inguinal adenopathy. Neurologic: Grossly intact, no focal deficits, moving all 4 extremities. Psychiatric: Normal mood and affect.  Laboratory Data: Lab Results  Component  Value Date   WBC 10.8 (H) 01/10/2013   HGB 16.0 01/10/2013   HCT 46.5 01/10/2013   MCV 93.7 01/10/2013   PLT 294.0 01/10/2013    Lab Results  Component Value Date   CREATININE 1.4 07/04/2013    No results found for: PSA  No results found for: TESTOSTERONE  No results found for: HGBA1C  Urinalysis    Component Value Date/Time   COLORURINE YELLOW 10/03/2011 2135   APPEARANCEUR Clear 09/16/2014 1105   LABSPEC 1.020 10/03/2011 2135   PHURINE 5.5 10/03/2011 2135   GLUCOSEU Negative 09/16/2014 1105   HGBUR NEGATIVE 10/03/2011 2135   BILIRUBINUR Negative 09/16/2014 1105   KETONESUR 15 (A) 10/03/2011 2135   PROTEINUR Negative 09/16/2014 1105   PROTEINUR NEGATIVE  10/03/2011 2135   UROBILINOGEN 0.2 10/03/2011 2135   NITRITE Negative 09/16/2014 1105   NITRITE NEGATIVE 10/03/2011 2135   LEUKOCYTESUR Negative 09/16/2014 1105     Assessment & Plan:   1. Urinary frequency, incontinence - may be functional. Bladder scan 468  - could be normal or retention - pt did not void here. Not sure when he went last. Restart tamsulosin. Needs cystoscopy.   2. Hard prostate - will check PSA to rule out an advanced PCa, not for early screening.   - Urinalysis, Complete - Bladder Scan (Post Void Residual) in office   No Follow-up on file.  Jerilee Field, MD  [facility] [address], [address] [place], [place] [address] [phone]

## 2016-05-25 LAB — PSA: PROSTATE SPECIFIC AG, SERUM: 2.8 ng/mL (ref 0.0–4.0)

## 2016-05-26 ENCOUNTER — Telehealth: Payer: Self-pay

## 2016-05-26 NOTE — Telephone Encounter (Signed)
Spoke with pt wife in reference to PSA results and cysto. Pt voiced understanding.

## 2016-06-21 ENCOUNTER — Other Ambulatory Visit: Payer: Medicare Other | Admitting: Urology

## 2016-06-21 ENCOUNTER — Encounter: Payer: Self-pay | Admitting: Urology

## 2016-07-21 ENCOUNTER — Encounter: Payer: Self-pay | Admitting: Urology

## 2016-07-21 ENCOUNTER — Ambulatory Visit: Payer: Medicare Other | Admitting: Urology

## 2016-07-21 VITALS — BP 113/77 | HR 79 | Ht 68.0 in | Wt 194.4 lb

## 2016-07-21 DIAGNOSIS — R3912 Poor urinary stream: Secondary | ICD-10-CM | POA: Diagnosis not present

## 2016-07-21 DIAGNOSIS — N400 Enlarged prostate without lower urinary tract symptoms: Secondary | ICD-10-CM

## 2016-07-21 LAB — URINALYSIS, COMPLETE
BILIRUBIN UA: NEGATIVE
Glucose, UA: NEGATIVE
Ketones, UA: NEGATIVE
Leukocytes, UA: NEGATIVE
NITRITE UA: NEGATIVE
PH UA: 5 (ref 5.0–7.5)
PROTEIN UA: NEGATIVE
RBC UA: NEGATIVE
SPEC GRAV UA: 1.02 (ref 1.005–1.030)
Urobilinogen, Ur: 0.2 mg/dL (ref 0.2–1.0)

## 2016-07-21 LAB — BLADDER SCAN AMB NON-IMAGING: SCAN RESULT: 208

## 2016-07-21 MED ORDER — FINASTERIDE 5 MG PO TABS: 5.0000 mg | ORAL_TABLET | Freq: Every day | ORAL | 11 refills | Status: AC

## 2016-07-21 MED ORDER — LIDOCAINE HCL 2 % EX GEL: 1.0000 "application " | Freq: Once | CUTANEOUS | Status: DC

## 2016-07-21 MED ORDER — TAMSULOSIN HCL 0.4 MG PO CAPS: 0.8000 mg | ORAL_CAPSULE | Freq: Every day | ORAL | 11 refills | Status: DC

## 2016-07-21 MED ORDER — CIPROFLOXACIN HCL 500 MG PO TABS: 500.0000 mg | ORAL_TABLET | Freq: Once | ORAL | Status: DC

## 2016-07-21 NOTE — Progress Notes (Signed)
07/21/2016 11:22 AM   Ernest [name] 1940/06/14 [id number]  Referring provider: Lauro Regulus, MD [address] [facility] [place], [place] [address]  Chief Complaint  Patient presents with  . Cysto    HPI: The patient is a 76 year old gentleman presents today for follow-up.  1) BPH with LUTS - seen for weak stream, incontinence in 2016. On tamsulosin 0.4 mg since March 2018. pvr 208. His symptoms somewhat improved. His stream is stronger at this time.  He has a very weak stream. He has frequency, but he "can't be still for very long". He's "always up". He wears pads and they're changed 3-4 times per day. He sometimes voids in the pads.   is unclear if he is incontinent or voluntarily voiding due to his mental status changes.  NG risk includes Alzheimer's dz and a brain aneurysm.    The patient was unable to participate in this conversation due to his Alzheimer's. His wife provided necessary information.    2) elevated PSA -   PSA history -  1.48 on 08/2013 16.4 on 08/2014  4.78, (.91 Free, 19% free) on 09/2014 - DRE was normal.    2.8 on March 2018   PMH: Past Medical History:  Diagnosis Date  . Atrial fibrillation (HCC)    not on coumadin due to hx of intracranial bleed  . Brain aneurysm   . Coronary artery disease    a. nonobstructive CAD by cath in the past;  b.  Lexiscan Myoview (10/2011): No scar or ischemia, EF 75% (normal study).  Marland Kitchen Hx of echocardiogram    a. Echo 10/04/11: mild LVH, EF 55-60%, mild MR, mod to severe LAE, mod RAE, PASP 23.   Marland Kitchen Hyperlipidemia   . Hypertension   . TIA (transient ischemic attack)     Surgical History: Past Surgical History:  Procedure Laterality Date  . APPENDECTOMY      Home Medications:  Allergies as of 07/21/2016      Reactions   Atorvastatin Other (See Comments)   REACTION: Muscle cramps   Simvastatin Other (See Comments)   REACTION: Muscle cramps      Medication List       Accurate as of  07/21/16 11:22 AM. Always use your most recent med list.          aspirin 81 MG tablet Take 162 mg by mouth daily.   buPROPion 150 MG 12 hr tablet Commonly known as:  WELLBUTRIN SR Take 150 mg by mouth 2 (two) times daily.   cholecalciferol 1000 units tablet Commonly known as:  VITAMIN D Take 1,000 Units by mouth daily.   donepezil 5 MG tablet Commonly known as:  ARICEPT Take 5 mg by mouth at bedtime.   finasteride 5 MG tablet Commonly known as:  PROSCAR Take 1 tablet (5 mg total) by mouth daily.   furosemide 20 MG tablet Commonly known as:  LASIX Take 1 tablet (20 mg total) by mouth daily as needed for fluid or edema.   memantine 10 MG tablet Commonly known as:  NAMENDA Take 10 mg by mouth 2 (two) times daily.   metoprolol tartrate 25 MG tablet Commonly known as:  LOPRESSOR Take 1 tablet (25 mg total) by mouth daily.   rosuvastatin 5 MG tablet Commonly known as:  CRESTOR TAKE 1 TABLET BY MOUTH EVERY DAY   tamsulosin 0.4 MG Caps capsule Commonly known as:  FLOMAX Take 1 capsule (0.4 mg total) by mouth daily after supper.  tamsulosin 0.4 MG Caps capsule Commonly known as:  FLOMAX Take 2 capsules (0.8 mg total) by mouth daily.   vitamin C 500 MG tablet Commonly known as:  ASCORBIC ACID Take 500 mg by mouth daily.       Allergies:  Allergies  Allergen Reactions  . Atorvastatin Other (See Comments)    REACTION: Muscle cramps  . Simvastatin Other (See Comments)    REACTION: Muscle cramps    Family History: Family History  Problem Relation Age of Onset  . Heart disease Father   . Hypertension Father   . Heart failure Father   . Arthritis Mother   . Kidney disease Neg Hx   . Prostate cancer Neg Hx     Social History:  reports that he quit smoking about 19 years ago. He quit smokeless tobacco use about 19 years ago. He reports that he does not drink alcohol or use drugs.  ROS:                                         Physical Exam: BP 113/77 (BP Location: Left Arm, Patient Position: Sitting, Cuff Size: Normal)   Pulse 79   Ht 5\' 8"  (1.727 m)   Wt 194 lb 6.4 oz (88.2 kg)   BMI 29.56 kg/m   Constitutional:  Alert and oriented, No acute distress. HEENT: [place] AT, moist mucus membranes.  Trachea midline, no masses. Cardiovascular: No clubbing, cyanosis, or edema. Respiratory: Normal respiratory effort, no increased work of breathing. GI: Abdomen is soft, nontender, nondistended, no abdominal masses GU: No CVA tenderness.  Skin: No rashes, bruises or suspicious lesions. Lymph: No cervical or inguinal adenopathy. Neurologic: Grossly intact, no focal deficits, moving all 4 extremities. Psychiatric: Normal mood and affect.  Laboratory Data: Lab Results  Component Value Date   WBC 10.8 (H) 01/10/2013   HGB 16.0 01/10/2013   HCT 46.5 01/10/2013   MCV 93.7 01/10/2013   PLT 294.0 01/10/2013    Lab Results  Component Value Date   CREATININE 1.4 07/04/2013    No results found for: PSA  No results found for: TESTOSTERONE  No results found for: HGBA1C  Urinalysis    Component Value Date/Time   COLORURINE YELLOW 10/03/2011 2135   APPEARANCEUR Clear 09/16/2014 1105   LABSPEC 1.020 10/03/2011 2135   PHURINE 5.5 10/03/2011 2135   GLUCOSEU Negative 09/16/2014 1105   HGBUR NEGATIVE 10/03/2011 2135   BILIRUBINUR Negative 09/16/2014 1105   KETONESUR 15 (A) 10/03/2011 2135   PROTEINUR Negative 09/16/2014 1105   PROTEINUR NEGATIVE 10/03/2011 2135   UROBILINOGEN 0.2 10/03/2011 2135   NITRITE Negative 09/16/2014 1105   NITRITE NEGATIVE 10/03/2011 2135   LEUKOCYTESUR Negative 09/16/2014 1105      Assessment & Plan:   1. BPH The patient's urinary symptoms are somewhat confusing because he cannot provide his own history due to his mental status changes. He does seem like a big issue according his wife is straining. We will put him on maximal medical therapy for his BPH to see if this helps make  him more comfortable. We will increase his Flomax 0.8 mg daily and add finasteride. The wife was informed of the finasteride will take up to 6 months to have its full effect. We'll see him back at that time. He is not a good surgical candidate at this time, and I do not think cystoscopy would provide any further  information for his care.  2. History of elevated PSA The patient's PSA has returned normal. Due to his multiple medical comorbidities including dementia and his age, we shall stop PSA screening at this time.  Return in about 6 months (around 01/21/2017).  Hildred LaserBrian James Cheo Selvey, MD  [facility] [address], [address] [place], [place] [address] [phone]

## 2016-08-02 ENCOUNTER — Other Ambulatory Visit: Payer: Self-pay

## 2016-08-02 DIAGNOSIS — N401 Enlarged prostate with lower urinary tract symptoms: Secondary | ICD-10-CM

## 2016-08-02 MED ORDER — TAMSULOSIN HCL 0.4 MG PO CAPS: 0.8000 mg | ORAL_CAPSULE | Freq: Every day | ORAL | 11 refills | Status: AC

## 2016-10-05 DEATH — deceased

## 2017-01-19 ENCOUNTER — Encounter: Payer: Self-pay | Admitting: Urology

## 2017-01-19 ENCOUNTER — Ambulatory Visit: Payer: Medicare Other | Admitting: Urology
